# Patient Record
Sex: Male | Born: 1935 | Race: White | Hispanic: No | Marital: Married | Smoking: Never smoker
Health system: Southern US, Community
[De-identification: ages and names within clinical notes are randomized; demographics above are authoritative.]

## PROBLEM LIST (undated history)

## (undated) DIAGNOSIS — F0390 Unspecified dementia without behavioral disturbance: Secondary | ICD-10-CM

## (undated) DIAGNOSIS — R269 Unspecified abnormalities of gait and mobility: Secondary | ICD-10-CM

## (undated) DIAGNOSIS — K5790 Diverticulosis of intestine, part unspecified, without perforation or abscess without bleeding: Secondary | ICD-10-CM

## (undated) DIAGNOSIS — M109 Gout, unspecified: Secondary | ICD-10-CM

## (undated) DIAGNOSIS — M216X9 Other acquired deformities of unspecified foot: Secondary | ICD-10-CM

## (undated) DIAGNOSIS — M1990 Unspecified osteoarthritis, unspecified site: Secondary | ICD-10-CM

## (undated) DIAGNOSIS — I10 Essential (primary) hypertension: Secondary | ICD-10-CM

## (undated) DIAGNOSIS — M5412 Radiculopathy, cervical region: Secondary | ICD-10-CM

## (undated) DIAGNOSIS — E785 Hyperlipidemia, unspecified: Secondary | ICD-10-CM

## (undated) DIAGNOSIS — R413 Other amnesia: Secondary | ICD-10-CM

## (undated) DIAGNOSIS — I639 Cerebral infarction, unspecified: Secondary | ICD-10-CM

## (undated) DIAGNOSIS — T4145XA Adverse effect of unspecified anesthetic, initial encounter: Secondary | ICD-10-CM

## (undated) DIAGNOSIS — G5600 Carpal tunnel syndrome, unspecified upper limb: Secondary | ICD-10-CM

## (undated) DIAGNOSIS — R609 Edema, unspecified: Secondary | ICD-10-CM

## (undated) DIAGNOSIS — G309 Alzheimer's disease, unspecified: Secondary | ICD-10-CM

## (undated) DIAGNOSIS — F0280 Dementia in other diseases classified elsewhere without behavioral disturbance: Secondary | ICD-10-CM

## (undated) DIAGNOSIS — T8859XA Other complications of anesthesia, initial encounter: Secondary | ICD-10-CM

## (undated) DIAGNOSIS — N400 Enlarged prostate without lower urinary tract symptoms: Secondary | ICD-10-CM

## (undated) DIAGNOSIS — M47812 Spondylosis without myelopathy or radiculopathy, cervical region: Secondary | ICD-10-CM

## (undated) HISTORY — DX: Benign prostatic hyperplasia without lower urinary tract symptoms: N40.0

## (undated) HISTORY — DX: Spondylosis without myelopathy or radiculopathy, cervical region: M47.812

## (undated) HISTORY — DX: Carpal tunnel syndrome, unspecified upper limb: G56.00

## (undated) HISTORY — DX: Hyperlipidemia, unspecified: E78.5

## (undated) HISTORY — PX: JOINT REPLACEMENT: SHX530

## (undated) HISTORY — DX: Edema, unspecified: R60.9

## (undated) HISTORY — DX: Essential (primary) hypertension: I10

## (undated) HISTORY — DX: Unspecified osteoarthritis, unspecified site: M19.90

## (undated) HISTORY — PX: CARPAL TUNNEL RELEASE: SHX101

## (undated) HISTORY — DX: Dementia in other diseases classified elsewhere, unspecified severity, without behavioral disturbance, psychotic disturbance, mood disturbance, and anxiety: F02.80

## (undated) HISTORY — PX: OTHER SURGICAL HISTORY: SHX169

## (undated) HISTORY — DX: Alzheimer's disease, unspecified: G30.9

## (undated) HISTORY — DX: Other amnesia: R41.3

## (undated) HISTORY — DX: Other acquired deformities of unspecified foot: M21.6X9

## (undated) HISTORY — DX: Diverticulosis of intestine, part unspecified, without perforation or abscess without bleeding: K57.90

## (undated) HISTORY — DX: Gout, unspecified: M10.9

## (undated) HISTORY — DX: Unspecified abnormalities of gait and mobility: R26.9

## (undated) HISTORY — PX: APPENDECTOMY: SHX54

## (undated) HISTORY — DX: Radiculopathy, cervical region: M54.12

## (undated) HISTORY — DX: Cerebral infarction, unspecified: I63.9

---

## 1997-11-14 ENCOUNTER — Ambulatory Visit (HOSPITAL_COMMUNITY): Admission: RE | Admit: 1997-11-14 | Discharge: 1997-11-14 | Payer: Self-pay | Admitting: Orthopedic Surgery

## 1998-02-06 ENCOUNTER — Encounter: Payer: Self-pay | Admitting: Orthopedic Surgery

## 1998-02-08 ENCOUNTER — Encounter: Payer: Self-pay | Admitting: Orthopedic Surgery

## 1998-02-08 ENCOUNTER — Inpatient Hospital Stay (HOSPITAL_COMMUNITY): Admission: RE | Admit: 1998-02-08 | Discharge: 1998-02-14 | Payer: Self-pay | Admitting: Orthopedic Surgery

## 1998-03-13 ENCOUNTER — Encounter: Admission: RE | Admit: 1998-03-13 | Discharge: 1998-04-26 | Payer: Self-pay | Admitting: Orthopedic Surgery

## 1999-12-27 ENCOUNTER — Encounter: Payer: Self-pay | Admitting: Neurology

## 1999-12-27 ENCOUNTER — Ambulatory Visit (HOSPITAL_COMMUNITY): Admission: RE | Admit: 1999-12-27 | Discharge: 1999-12-27 | Payer: Self-pay | Admitting: Neurology

## 2001-05-19 ENCOUNTER — Encounter: Payer: Self-pay | Admitting: Orthopedic Surgery

## 2001-05-24 ENCOUNTER — Inpatient Hospital Stay (HOSPITAL_COMMUNITY): Admission: RE | Admit: 2001-05-24 | Discharge: 2001-05-29 | Payer: Self-pay | Admitting: Orthopedic Surgery

## 2001-06-17 ENCOUNTER — Encounter: Admission: RE | Admit: 2001-06-17 | Discharge: 2001-09-15 | Payer: Self-pay | Admitting: Orthopedic Surgery

## 2001-08-31 ENCOUNTER — Ambulatory Visit (HOSPITAL_COMMUNITY): Admission: RE | Admit: 2001-08-31 | Discharge: 2001-08-31 | Payer: Self-pay | Admitting: Gastroenterology

## 2001-08-31 ENCOUNTER — Encounter: Payer: Self-pay | Admitting: Internal Medicine

## 2001-08-31 ENCOUNTER — Encounter (INDEPENDENT_AMBULATORY_CARE_PROVIDER_SITE_OTHER): Payer: Self-pay | Admitting: Specialist

## 2001-09-08 ENCOUNTER — Encounter: Payer: Self-pay | Admitting: Gastroenterology

## 2001-09-08 ENCOUNTER — Ambulatory Visit (HOSPITAL_COMMUNITY): Admission: RE | Admit: 2001-09-08 | Discharge: 2001-09-08 | Payer: Self-pay | Admitting: Gastroenterology

## 2003-12-12 ENCOUNTER — Ambulatory Visit: Payer: Self-pay | Admitting: Internal Medicine

## 2003-12-18 ENCOUNTER — Ambulatory Visit (HOSPITAL_COMMUNITY): Admission: RE | Admit: 2003-12-18 | Discharge: 2003-12-18 | Payer: Self-pay | Admitting: Orthopedic Surgery

## 2003-12-18 ENCOUNTER — Encounter: Payer: Self-pay | Admitting: Orthopedic Surgery

## 2004-01-30 ENCOUNTER — Ambulatory Visit (HOSPITAL_COMMUNITY): Admission: RE | Admit: 2004-01-30 | Discharge: 2004-01-30 | Payer: Self-pay | Admitting: Neurology

## 2004-03-26 ENCOUNTER — Ambulatory Visit (HOSPITAL_COMMUNITY): Admission: RE | Admit: 2004-03-26 | Discharge: 2004-03-26 | Payer: Self-pay | Admitting: Orthopedic Surgery

## 2004-03-26 ENCOUNTER — Ambulatory Visit (HOSPITAL_BASED_OUTPATIENT_CLINIC_OR_DEPARTMENT_OTHER): Admission: RE | Admit: 2004-03-26 | Discharge: 2004-03-26 | Payer: Self-pay | Admitting: Orthopedic Surgery

## 2004-04-30 ENCOUNTER — Encounter
Admission: RE | Admit: 2004-04-30 | Discharge: 2004-08-05 | Payer: Self-pay | Admitting: Physical Medicine and Rehabilitation

## 2004-05-29 ENCOUNTER — Ambulatory Visit: Payer: Self-pay | Admitting: Internal Medicine

## 2004-08-05 ENCOUNTER — Ambulatory Visit: Payer: Self-pay | Admitting: Internal Medicine

## 2004-09-02 ENCOUNTER — Ambulatory Visit: Payer: Self-pay | Admitting: Internal Medicine

## 2004-12-02 ENCOUNTER — Ambulatory Visit: Payer: Self-pay | Admitting: Internal Medicine

## 2005-02-06 ENCOUNTER — Ambulatory Visit: Payer: Self-pay | Admitting: Licensed Clinical Social Worker

## 2005-02-13 ENCOUNTER — Ambulatory Visit: Payer: Self-pay | Admitting: Licensed Clinical Social Worker

## 2005-02-20 ENCOUNTER — Ambulatory Visit: Payer: Self-pay | Admitting: Licensed Clinical Social Worker

## 2005-03-04 ENCOUNTER — Ambulatory Visit: Payer: Self-pay | Admitting: Internal Medicine

## 2005-04-01 ENCOUNTER — Encounter: Admission: RE | Admit: 2005-04-01 | Discharge: 2005-04-01 | Payer: Self-pay | Admitting: Neurology

## 2005-05-28 ENCOUNTER — Encounter: Payer: Self-pay | Admitting: Orthopedic Surgery

## 2005-06-05 ENCOUNTER — Ambulatory Visit: Payer: Self-pay | Admitting: Internal Medicine

## 2005-08-15 ENCOUNTER — Ambulatory Visit: Payer: Self-pay | Admitting: Internal Medicine

## 2005-08-29 ENCOUNTER — Ambulatory Visit: Payer: Self-pay | Admitting: Internal Medicine

## 2005-09-08 ENCOUNTER — Ambulatory Visit: Payer: Self-pay | Admitting: Internal Medicine

## 2005-11-21 ENCOUNTER — Ambulatory Visit: Payer: Self-pay | Admitting: Internal Medicine

## 2005-12-29 ENCOUNTER — Ambulatory Visit: Payer: Self-pay | Admitting: Internal Medicine

## 2006-03-30 ENCOUNTER — Ambulatory Visit: Payer: Self-pay | Admitting: Internal Medicine

## 2006-08-31 DIAGNOSIS — N400 Enlarged prostate without lower urinary tract symptoms: Secondary | ICD-10-CM

## 2006-08-31 DIAGNOSIS — M1990 Unspecified osteoarthritis, unspecified site: Secondary | ICD-10-CM | POA: Insufficient documentation

## 2006-08-31 DIAGNOSIS — I10 Essential (primary) hypertension: Secondary | ICD-10-CM | POA: Insufficient documentation

## 2006-08-31 DIAGNOSIS — Z8679 Personal history of other diseases of the circulatory system: Secondary | ICD-10-CM | POA: Insufficient documentation

## 2006-09-11 ENCOUNTER — Telehealth: Payer: Self-pay | Admitting: *Deleted

## 2006-10-29 ENCOUNTER — Telehealth: Payer: Self-pay | Admitting: Internal Medicine

## 2006-12-03 ENCOUNTER — Ambulatory Visit: Payer: Self-pay | Admitting: Internal Medicine

## 2007-01-19 ENCOUNTER — Emergency Department (HOSPITAL_COMMUNITY): Admission: EM | Admit: 2007-01-19 | Discharge: 2007-01-20 | Payer: Self-pay | Admitting: Emergency Medicine

## 2007-11-03 ENCOUNTER — Ambulatory Visit: Payer: Self-pay | Admitting: Internal Medicine

## 2007-11-03 DIAGNOSIS — G5600 Carpal tunnel syndrome, unspecified upper limb: Secondary | ICD-10-CM

## 2007-11-03 DIAGNOSIS — E785 Hyperlipidemia, unspecified: Secondary | ICD-10-CM | POA: Insufficient documentation

## 2007-11-03 DIAGNOSIS — F068 Other specified mental disorders due to known physiological condition: Secondary | ICD-10-CM

## 2007-11-03 DIAGNOSIS — K5730 Diverticulosis of large intestine without perforation or abscess without bleeding: Secondary | ICD-10-CM | POA: Insufficient documentation

## 2007-11-03 DIAGNOSIS — Z96659 Presence of unspecified artificial knee joint: Secondary | ICD-10-CM | POA: Insufficient documentation

## 2007-11-03 DIAGNOSIS — R269 Unspecified abnormalities of gait and mobility: Secondary | ICD-10-CM

## 2007-11-03 DIAGNOSIS — Z96649 Presence of unspecified artificial hip joint: Secondary | ICD-10-CM

## 2008-03-01 ENCOUNTER — Ambulatory Visit: Payer: Self-pay | Admitting: Internal Medicine

## 2008-03-01 DIAGNOSIS — G63 Polyneuropathy in diseases classified elsewhere: Secondary | ICD-10-CM | POA: Insufficient documentation

## 2008-03-06 ENCOUNTER — Encounter: Payer: Self-pay | Admitting: Internal Medicine

## 2008-03-16 LAB — CONVERTED CEMR LAB
BUN: 29 mg/dL — ABNORMAL HIGH (ref 6–23)
CO2: 26 meq/L (ref 19–32)
Calcium: 9.3 mg/dL (ref 8.4–10.5)
Creatinine, Ser: 1.2 mg/dL (ref 0.4–1.5)
GFR calc non Af Amer: 62 mL/min
Glucose, Bld: 110 mg/dL — ABNORMAL HIGH (ref 70–99)
Vitamin B-12: 504 pg/mL (ref 211–911)

## 2008-03-21 ENCOUNTER — Ambulatory Visit: Payer: Self-pay | Admitting: Internal Medicine

## 2008-06-01 ENCOUNTER — Ambulatory Visit: Payer: Self-pay | Admitting: Internal Medicine

## 2008-07-14 ENCOUNTER — Encounter: Payer: Self-pay | Admitting: Internal Medicine

## 2008-09-04 ENCOUNTER — Ambulatory Visit: Payer: Self-pay | Admitting: Internal Medicine

## 2008-09-04 DIAGNOSIS — E291 Testicular hypofunction: Secondary | ICD-10-CM

## 2008-09-04 LAB — CONVERTED CEMR LAB
CO2: 27 meq/L (ref 19–32)
Calcium: 9.3 mg/dL (ref 8.4–10.5)
Chloride: 112 meq/L (ref 96–112)
Creatinine, Ser: 1.2 mg/dL (ref 0.4–1.5)
GFR calc non Af Amer: 61.66 mL/min (ref >60)
Glucose, Bld: 101 mg/dL — ABNORMAL HIGH (ref 70–99)

## 2008-11-27 ENCOUNTER — Ambulatory Visit: Payer: Self-pay | Admitting: Internal Medicine

## 2009-01-25 ENCOUNTER — Ambulatory Visit: Payer: Self-pay | Admitting: Internal Medicine

## 2009-03-05 ENCOUNTER — Encounter: Payer: Self-pay | Admitting: Internal Medicine

## 2009-03-15 ENCOUNTER — Encounter: Admission: RE | Admit: 2009-03-15 | Discharge: 2009-03-15 | Payer: Self-pay | Admitting: Neurology

## 2009-03-23 ENCOUNTER — Ambulatory Visit: Payer: Self-pay | Admitting: Internal Medicine

## 2009-03-23 DIAGNOSIS — E538 Deficiency of other specified B group vitamins: Secondary | ICD-10-CM

## 2009-04-20 ENCOUNTER — Ambulatory Visit: Payer: Self-pay | Admitting: Internal Medicine

## 2009-05-25 ENCOUNTER — Ambulatory Visit: Payer: Self-pay | Admitting: Internal Medicine

## 2009-06-07 ENCOUNTER — Encounter: Payer: Self-pay | Admitting: Internal Medicine

## 2009-08-23 ENCOUNTER — Ambulatory Visit: Payer: Self-pay | Admitting: Internal Medicine

## 2009-11-22 ENCOUNTER — Ambulatory Visit: Payer: Self-pay | Admitting: Internal Medicine

## 2009-11-22 LAB — CONVERTED CEMR LAB
CO2: 28 meq/L (ref 19–32)
Chloride: 109 meq/L (ref 96–112)
Folate: 12.3 ng/mL
GFR calc non Af Amer: 61.55 mL/min (ref >60)
Sodium: 144 meq/L (ref 135–145)
Testosterone: 308.62 ng/dL — ABNORMAL LOW (ref 350.00–890.00)

## 2010-02-21 ENCOUNTER — Ambulatory Visit
Admission: RE | Admit: 2010-02-21 | Discharge: 2010-02-21 | Payer: Self-pay | Source: Home / Self Care | Attending: Internal Medicine | Admitting: Internal Medicine

## 2010-02-21 ENCOUNTER — Encounter: Payer: Self-pay | Admitting: Internal Medicine

## 2010-02-24 LAB — CONVERTED CEMR LAB
ALT: 32 units/L (ref 0–53)
AST: 32 units/L (ref 0–37)
Alkaline Phosphatase: 72 units/L (ref 39–117)
Bilirubin, Direct: 0.2 mg/dL (ref 0.0–0.3)
Cholesterol: 193 mg/dL (ref 0–200)
GFR calc non Af Amer: 48 mL/min
HDL goal, serum: 40 mg/dL
HDL: 37.3 mg/dL — ABNORMAL LOW (ref >39.0)
LDL Cholesterol: 137 mg/dL — ABNORMAL HIGH (ref 0–99)
LDL Goal: 130 mg/dL
Potassium: 5.4 meq/L — ABNORMAL HIGH (ref 3.5–5.1)
Total Protein: 7 g/dL (ref 6.0–8.3)
VLDL: 19 mg/dL (ref 0–40)

## 2010-02-26 NOTE — Assessment & Plan Note (Signed)
Summary: 3 mo rov/mm/pt rsc from bmp/cjr   Vital Signs:  Patient profile:   77 year old male Height:      71 inches Weight:      198 pounds BMI:     27.72 Temp:     98.2 degrees F oral Pulse rate:   76 / minute Resp:     14 per minute BP sitting:   136 / 80  (left arm)  Vitals Entered By: Willy Eddy, LPN (August 23, 2009 9:36 AM) CC: roa, Hypertension Management Is Patient Diabetic? No   Primary Care Provider:  Stacie Glaze MD  CC:  roa and Hypertension Management.  History of Present Illness: The pt presents for follow up for HTN and memory follow up  Hypertension Follow-Up      This is an 77 year old man who presents for Hypertension follow-up.  The patient denies lightheadedness, urinary frequency, headaches, edema, impotence, rash, and fatigue.  The patient denies the following associated symptoms: chest pain, chest pressure, exercise intolerance, dyspnea, palpitations, syncope, leg edema, and pedal edema.    Hypertension History:      He denies headache, chest pain, palpitations, dyspnea with exertion, orthopnea, PND, peripheral edema, visual symptoms, neurologic problems, syncope, and side effects from treatment.        Positive major cardiovascular risk factors include male age 75 years old or older, hyperlipidemia, and hypertension.  Negative major cardiovascular risk factors include negative family history for ischemic heart disease and non-tobacco-user status.     Preventive Screening-Counseling & Management  Alcohol-Tobacco     Alcohol drinks/day: 3     Smoking Status: never     Passive Smoke Exposure: no  Problems Prior to Update: 1)  B12 Deficiency  (ICD-266.2) 2)  Hypogonadism  (ICD-257.2) 3)  Polyneuropathy Other Diseases Classified Elsw  (ICD-357.4) 4)  Diverticulosis, Colon  (ICD-562.10) 5)  Carpal Tunnel Syndrome  (ICD-354.0) 6)  Unsteady Gait  (ICD-781.2) 7)  Memory Loss  (ICD-780.93) 8)  Hip Replacement, Total, Hx of  (ICD-V43.64) 9)   Total Knee Replacement, Right, Hx of  (ICD-V43.65) 10)  Hyperlipidemia  (ICD-272.4) 11)  Cerebrovascular Accident, Hx of  (ICD-V12.50) 12)  Family History Diabetes 1st Degree Relative  (ICD-V18.0) 13)  Benign Prostatic Hypertrophy  (ICD-600.00) 14)  Osteoarthritis  (ICD-715.90) 15)  Hypertension  (ICD-401.9)  Medications Prior to Update: 1)  Aspirin 81 Mg  Tabs (Aspirin) .... Take 1 Tablet By Mouth Once A Day 2)  Lisinopril 10 Mg  Tabs (Lisinopril) .... Take 1 Tablet By Mouth Once A Day 3)  Restoril 30 Mg  Caps (Temazepam) .... Q Hs As Needed 4)  Namenda 10 Mg Tabs (Memantine Hcl) .... One By Mouth Two Times A Day 5)  Cyanocobalamin 1000 Mcg/ml Soln (Cyanocobalamin) .Marland Kitchen.. 1 Ml Monthly 6)  Aricept 5 Mg Tabs (Donepezil Hcl) .Marland Kitchen.. 1 Once Daily  Current Medications (verified): 1)  Aspirin 81 Mg  Tabs (Aspirin) .... Take 1 Tablet By Mouth Once A Day 2)  Lisinopril 10 Mg  Tabs (Lisinopril) .... Take 1 Tablet By Mouth Once A Day 3)  Restoril 30 Mg  Caps (Temazepam) .... Q Hs As Needed 4)  Namenda 10 Mg Tabs (Memantine Hcl) .... One By Mouth Two Times A Day 5)  Cyanocobalamin 1000 Mcg/ml Soln (Cyanocobalamin) .Marland Kitchen.. 1 Ml Monthly 6)  Aricept 5 Mg Tabs (Donepezil Hcl) .Marland Kitchen.. 1 Once Daily  Allergies (verified): No Known Drug Allergies  Past History:  Family History: Last updated: 08/31/2006 Family History  Diabetes 1st degree relative Family History of Stroke F 1st degree relative <60 Fam hx MI  Social History: Last updated: 08/31/2006 Never Smoked Alcohol use-yes Married  Risk Factors: Alcohol Use: 3 (08/23/2009) Exercise: no (11/03/2007)  Risk Factors: Smoking Status: never (08/23/2009) Passive Smoke Exposure: no (08/23/2009)  Past medical, surgical, family and social histories (including risk factors) reviewed, and no changes noted (except as noted below).  Past Medical History: Reviewed history from 11/03/2007 and no changes required. Hypertension Osteoarthritis Benign  prostatic hypertrophy Carpel Tunnel Lipids Cerebrovascular accident, hx of-1988 Hyperlipidemia Diverticulosis, colon  Past Surgical History: Reviewed history from 08/31/2006 and no changes required. Appendectomy Colonoscopy-09/23/2004 Carpal tunnel release Hip replacement-left Knee replacement-right  Family History: Reviewed history from 08/31/2006 and no changes required. Family History Diabetes 1st degree relative Family History of Stroke F 1st degree relative <60 Fam hx MI  Social History: Reviewed history from 08/31/2006 and no changes required. Never Smoked Alcohol use-yes Married  Review of Systems  The patient denies anorexia, fever, weight loss, weight gain, vision loss, decreased hearing, hoarseness, chest pain, syncope, dyspnea on exertion, peripheral edema, prolonged cough, headaches, hemoptysis, abdominal pain, melena, hematochezia, severe indigestion/heartburn, hematuria, incontinence, genital sores, muscle weakness, suspicious skin lesions, transient blindness, difficulty walking, depression, unusual weight change, abnormal bleeding, enlarged lymph nodes, angioedema, and breast masses.    Physical Exam  General:  well-developed and well-nourished.   Head:  normocephalic and male-pattern balding.   Eyes:  pupils equal and pupils round.   Ears:  R ear normal and L ear normal.   Nose:  no external deformity and no nasal discharge.   Neck:  nuchal rigidity and decreased ROM.   Lungs:  normal respiratory effort and no wheezes.   Heart:  normal rate, regular rhythm, and Grade   2/6 systolic ejection murmur.   Abdomen:  soft, non-tender, and no masses.   Msk:  decreased ROM.   upper extremity muscle waisting Neurologic:  alert & oriented X3 and cranial nerves II-XII intact.     Impression & Recommendations:  Problem # 1:  MEMORY LOSS (ICD-780.93) progressive but not accellerating  Problem # 2:  TOTAL KNEE REPLACEMENT, RIGHT, HX OF (ICD-V43.65) stable  still  goes to PT about twice a week  Problem # 3:  UNSTEADY GAIT (ICD-781.2) still going to exercize classes  Problem # 4:  B12 DEFICIENCY (ICD-266.2) Assessment: Unchanged b 12 shots for neuropathy Orders: Vit B12 1000 mcg (J3420) Admin of Therapeutic Inj  intramuscular or subcutaneous (21308)  Problem # 5:  HYPERTENSION (ICD-401.9)  His updated medication list for this problem includes:    Lisinopril 10 Mg Tabs (Lisinopril) .Marland Kitchen... Take 1 tablet by mouth once a day  BP today: 136/80 Prior BP: 140/80 (05/25/2009)  10 Yr Risk Heart Disease: 22 % Prior 10 Yr Risk Heart Disease: 33 % (05/25/2009)  Labs Reviewed: K+: 4.9 (05/25/2009) Creat: : 1.2 (05/25/2009)   Chol: 185 (05/25/2009)   HDL: 45.90 (05/25/2009)   LDL: 137 (11/03/2007)   TG: 93 (11/03/2007)  Problem # 6:  BENIGN PROSTATIC HYPERTROPHY (ICD-600.00)  PSA: 4.01 (09/04/2008)     Complete Medication List: 1)  Aspirin 81 Mg Tabs (Aspirin) .... Take 1 tablet by mouth once a day 2)  Lisinopril 10 Mg Tabs (Lisinopril) .... Take 1 tablet by mouth once a day 3)  Restoril 30 Mg Caps (Temazepam) .... Q hs as needed 4)  Namenda 10 Mg Tabs (Memantine hcl) .... One by mouth two times a day 5)  Cyanocobalamin 1000  Mcg/ml Soln (Cyanocobalamin) .Marland Kitchen.. 1 ml monthly 6)  Aricept 5 Mg Tabs (Donepezil hcl) .Marland Kitchen.. 1 once daily  Hypertension Assessment/Plan:      The patient's hypertensive risk group is category B: At least one risk factor (excluding diabetes) with no target organ damage.  His calculated 10 year risk of coronary heart disease is 22 %.  Today's blood pressure is 136/80.  His blood pressure goal is < 140/90.  Patient Instructions: 1)  Please schedule a follow-up appointment in 3 months.   Medication Administration  Injection # 1:    Medication: Vit B12 1000 mcg    Diagnosis: B12 DEFICIENCY (ICD-266.2)    Route: IM    Site: R deltoid    Exp Date: 10/25/2010    Lot #: [id number]    Mfr: American Regent    Patient tolerated  injection without complications    Given by: Willy Eddy, LPN (August 23, 2009 9:37 AM)  Orders Added: 1)  Vit B12 1000 mcg [J3420] 2)  Admin of Therapeutic Inj  intramuscular or subcutaneous [96372] 3)  Est. Patient Level IV [16109]

## 2010-02-26 NOTE — Assessment & Plan Note (Signed)
Summary: 2 month rov/njr   Vital Signs:  Patient profile:   77 year old male Height:      71 inches Weight:      198 pounds BMI:     27.72 Temp:     98.2 degrees F oral Pulse rate:   76 / minute Resp:     14 per minute BP sitting:   122 / 78  (left arm)  Vitals Entered By: Willy Eddy, LPN (March 23, 2009 9:13 AM) CC: roa, Hypertension Management   CC:  roa and Hypertension Management.  History of Present Illness: folllow up for increased OBS and gait disorder with progressive dementia HTN and reveoiwe dof med use and b12 injectons wife is present wqith pt for rreveiw of all meds the consult form Dr Sandria Manly was reveiwed with the pt.  Hypertension History:      He denies headache, chest pain, palpitations, dyspnea with exertion, orthopnea, PND, peripheral edema, visual symptoms, neurologic problems, syncope, and side effects from treatment.        Positive major cardiovascular risk factors include male age 8 years old or older, hyperlipidemia, and hypertension.  Negative major cardiovascular risk factors include negative family history for ischemic heart disease and non-tobacco-user status.     Preventive Screening-Counseling & Management  Alcohol-Tobacco     Alcohol drinks/day: 3     Smoking Status: never     Passive Smoke Exposure: no  Problems Prior to Update: 1)  B12 Deficiency  (ICD-266.2) 2)  Hypogonadism  (ICD-257.2) 3)  Polyneuropathy Other Diseases Classified Elsw  (ICD-357.4) 4)  Diverticulosis, Colon  (ICD-562.10) 5)  Carpal Tunnel Syndrome  (ICD-354.0) 6)  Unsteady Gait  (ICD-781.2) 7)  Memory Loss  (ICD-780.93) 8)  Hip Replacement, Total, Hx of  (ICD-V43.64) 9)  Total Knee Replacement, Right, Hx of  (ICD-V43.65) 10)  Hyperlipidemia  (ICD-272.4) 11)  Cerebrovascular Accident, Hx of  (ICD-V12.50) 12)  Family History Diabetes 1st Degree Relative  (ICD-V18.0) 13)  Benign Prostatic Hypertrophy  (ICD-600.00) 14)  Osteoarthritis  (ICD-715.90) 15)   Hypertension  (ICD-401.9)  Medications Prior to Update: 1)  Aspirin 81 Mg  Tabs (Aspirin) .... Take 1 Tablet By Mouth Once A Day 2)  Lisinopril 10 Mg  Tabs (Lisinopril) .... Take 1 Tablet By Mouth Once A Day 3)  Restoril 30 Mg  Caps (Temazepam) .... Q Hs As Needed 4)  D 1000 Plus  Tabs (Fa-Cyanocobalamin-B6-D-Ca) .Marland Kitchen.. 1 Once Daily 5)  Fish Oil Concentrate 1000 Mg Caps (Omega-3 Fatty Acids) .... Two Capsules Two Times A Day 6)  Namenda 10 Mg Tabs (Memantine Hcl) .... One By Mouth Two Times A Day 7)  Cyanocobalamin 1000 Mcg/ml Soln (Cyanocobalamin) .Marland Kitchen.. 1 Ml Monthly  Current Medications (verified): 1)  Aspirin 81 Mg  Tabs (Aspirin) .... Take 1 Tablet By Mouth Once A Day 2)  Lisinopril 10 Mg  Tabs (Lisinopril) .... Take 1 Tablet By Mouth Once A Day 3)  Restoril 30 Mg  Caps (Temazepam) .... Q Hs As Needed 4)  Namenda 10 Mg Tabs (Memantine Hcl) .... One By Mouth Two Times A Day 5)  Cyanocobalamin 1000 Mcg/ml Soln (Cyanocobalamin) .Marland Kitchen.. 1 Ml Monthly 6)  Aricept 5 Mg Tabs (Donepezil Hcl) .Marland Kitchen.. 1 Once Daily  Allergies (verified): No Known Drug Allergies  Past History:  Family History: Last updated: 08/31/2006 Family History Diabetes 1st degree relative Family History of Stroke F 1st degree relative <60 Fam hx MI  Social History: Last updated: 08/31/2006 Never Smoked Alcohol use-yes Married  Risk Factors: Alcohol Use: 3 (03/23/2009) Exercise: no (11/03/2007)  Risk Factors: Smoking Status: never (03/23/2009) Passive Smoke Exposure: no (03/23/2009)  Past medical, surgical, family and social histories (including risk factors) reviewed, and no changes noted (except as noted below).  Past Medical History: Reviewed history from 11/03/2007 and no changes required. Hypertension Osteoarthritis Benign prostatic hypertrophy Carpel Tunnel Lipids Cerebrovascular accident, hx of-1988 Hyperlipidemia Diverticulosis, colon  Past Surgical History: Reviewed history from 08/31/2006 and no  changes required. Appendectomy Colonoscopy-09/23/2004 Carpal tunnel release Hip replacement-left Knee replacement-right  Family History: Reviewed history from 08/31/2006 and no changes required. Family History Diabetes 1st degree relative Family History of Stroke F 1st degree relative <60 Fam hx MI  Social History: Reviewed history from 08/31/2006 and no changes required. Never Smoked Alcohol use-yes Married  Review of Systems  The patient denies anorexia, fever, weight loss, weight gain, vision loss, decreased hearing, hoarseness, chest pain, syncope, dyspnea on exertion, peripheral edema, prolonged cough, headaches, hemoptysis, abdominal pain, melena, hematochezia, severe indigestion/heartburn, hematuria, incontinence, genital sores, muscle weakness, suspicious skin lesions, transient blindness, difficulty walking, depression, unusual weight change, abnormal bleeding, enlarged lymph nodes, angioedema, and breast masses.    Physical Exam  General:  well-developed and well-nourished.   Head:  normocephalic and male-pattern balding.   Eyes:  pupils equal and pupils round.   Ears:  R ear normal and L ear normal.   Nose:  no external deformity and no nasal discharge.   Mouth:  pharynx pink and moist and fair dentition.   Neck:  nuchal rigidity and decreased ROM.   Lungs:  normal respiratory effort and no wheezes.   Heart:  normal rate, regular rhythm, and Grade   2/6 systolic ejection murmur.     Impression & Recommendations:  Problem # 1:  B12 DEFICIENCY (ICD-266.2) has been on long standing B12 def document and has been on both shots and nasal his wife will help him  to recall getting the shots Orders: Vit B12 1000 mcg (J3420) Admin of Therapeutic Inj  intramuscular or subcutaneous (60454)  Problem # 2:  UNSTEADY GAIT (ICD-781.2) due to neuropathy and  Problem # 3:  MEMORY LOSS (ICD-780.93) OBS form ischemic change add vit e 400 iu  Problem # 4:  POLYNEUROPATHY  OTHER DISEASES CLASSIFIED ELSW (ICD-357.4) continue the b12 shots  Problem # 5:  HYPERTENSION (ICD-401.9)  His updated medication list for this problem includes:    Lisinopril 10 Mg Tabs (Lisinopril) .Marland Kitchen... Take 1 tablet by mouth once a day  BP today: 122/78 Prior BP: 126/80 (01/25/2009)  Prior 10 Yr Risk Heart Disease: 22 % (01/25/2009)  Labs Reviewed: K+: 5.0 (09/04/2008) Creat: : 1.2 (09/04/2008)   Chol: 185 (09/04/2008)   HDL: 41.20 (09/04/2008)   LDL: 137 (11/03/2007)   TG: 93 (11/03/2007)  Complete Medication List: 1)  Aspirin 81 Mg Tabs (Aspirin) .... Take 1 tablet by mouth once a day 2)  Lisinopril 10 Mg Tabs (Lisinopril) .... Take 1 tablet by mouth once a day 3)  Restoril 30 Mg Caps (Temazepam) .... Q hs as needed 4)  Namenda 10 Mg Tabs (Memantine hcl) .... One by mouth two times a day 5)  Cyanocobalamin 1000 Mcg/ml Soln (Cyanocobalamin) .Marland Kitchen.. 1 ml monthly 6)  Aricept 5 Mg Tabs (Donepezil hcl) .Marland Kitchen.. 1 once daily  Hypertension Assessment/Plan:      The patient's hypertensive risk group is category B: At least one risk factor (excluding diabetes) with no target organ damage.  His calculated 10 year risk of coronary  heart disease is 22 %.  Today's blood pressure is 122/78.  His blood pressure goal is < 140/90.  Patient Instructions: 1)  Please schedule a follow-up appointment in 2 months. 2)  get b 12 shot in one month 3)  cannot drive until released by Dr Sandria Manly 4)   and continue the let your wife administer all medications   Medication Administration  Injection # 1:    Medication: Vit B12 1000 mcg    Diagnosis: B12 DEFICIENCY (ICD-266.2)    Route: IM    Site: L deltoid    Exp Date: 09/27/2010    Lot #: [id number]    Mfr: American Regent    Patient tolerated injection without complications    Given by: Willy Eddy, LPN (March 23, 2009 9:16 AM)  Orders Added: 1)  Vit B12 1000 mcg [J3420] 2)  Admin of Therapeutic Inj  intramuscular or subcutaneous [96372] 3)   Est. Patient Level IV [11914]

## 2010-02-26 NOTE — Assessment & Plan Note (Signed)
Summary: 3 MNTH ROV//SLM   Vital Signs:  Patient profile:   77 year old male Height:      71 inches Weight:      200 pounds BMI:     28.00 Temp:     98.2 degrees F oral Pulse rate:   72 / minute Resp:     14 per minute BP sitting:   130 / 80  (left arm)  Vitals Entered By: Willy Eddy, LPN (November 22, 2009 9:32 AM)  Nutrition Counseling: Patient's BMI is greater than 25 and therefore counseled on weight management options. CC: roa, Hypertension Management Is Patient Diabetic? No   Primary Care Provider:  Stacie Glaze MD  CC:  roa and Hypertension Management.  History of Present Illness: the pt has been "doing well" Has recieved immunizations Stll going the PT twice a weeks Has been cutting back down  on driving  and has not been driving at night HTN controlled believs that he has been taking all of his medications as directed "once a day"  Hypertension History:      He denies headache, chest pain, palpitations, dyspnea with exertion, orthopnea, PND, peripheral edema, visual symptoms, neurologic problems, syncope, and side effects from treatment.        Positive major cardiovascular risk factors include male age 39 years old or older, hyperlipidemia, and hypertension.  Negative major cardiovascular risk factors include negative family history for ischemic heart disease and non-tobacco-user status.     Preventive Screening-Counseling & Management  Alcohol-Tobacco     Alcohol drinks/day: 3     Smoking Status: never     Passive Smoke Exposure: no     Tobacco Counseling: not indicated; no tobacco use  Problems Prior to Update: 1)  B12 Deficiency  (ICD-266.2) 2)  Hypogonadism  (ICD-257.2) 3)  Polyneuropathy Other Diseases Classified Elsw  (ICD-357.4) 4)  Diverticulosis, Colon  (ICD-562.10) 5)  Carpal Tunnel Syndrome  (ICD-354.0) 6)  Unsteady Gait  (ICD-781.2) 7)  Memory Loss  (ICD-780.93) 8)  Hip Replacement, Total, Hx of  (ICD-V43.64) 9)  Total Knee  Replacement, Right, Hx of  (ICD-V43.65) 10)  Hyperlipidemia  (ICD-272.4) 11)  Cerebrovascular Accident, Hx of  (ICD-V12.50) 12)  Family History Diabetes 1st Degree Relative  (ICD-V18.0) 13)  Benign Prostatic Hypertrophy  (ICD-600.00) 14)  Osteoarthritis  (ICD-715.90) 15)  Hypertension  (ICD-401.9)  Current Problems (verified): 1)  B12 Deficiency  (ICD-266.2) 2)  Hypogonadism  (ICD-257.2) 3)  Polyneuropathy Other Diseases Classified Elsw  (ICD-357.4) 4)  Diverticulosis, Colon  (ICD-562.10) 5)  Carpal Tunnel Syndrome  (ICD-354.0) 6)  Unsteady Gait  (ICD-781.2) 7)  Memory Loss  (ICD-780.93) 8)  Hip Replacement, Total, Hx of  (ICD-V43.64) 9)  Total Knee Replacement, Right, Hx of  (ICD-V43.65) 10)  Hyperlipidemia  (ICD-272.4) 11)  Cerebrovascular Accident, Hx of  (ICD-V12.50) 12)  Family History Diabetes 1st Degree Relative  (ICD-V18.0) 13)  Benign Prostatic Hypertrophy  (ICD-600.00) 14)  Osteoarthritis  (ICD-715.90) 15)  Hypertension  (ICD-401.9)  Medications Prior to Update: 1)  Aspirin 81 Mg  Tabs (Aspirin) .... Take 1 Tablet By Mouth Once A Day 2)  Lisinopril 10 Mg  Tabs (Lisinopril) .... Take 1 Tablet By Mouth Once A Day 3)  Restoril 30 Mg  Caps (Temazepam) .... Q Hs As Needed 4)  Namenda 10 Mg Tabs (Memantine Hcl) .... One By Mouth Two Times A Day 5)  Cyanocobalamin 1000 Mcg/ml Soln (Cyanocobalamin) .Marland Kitchen.. 1 Ml Monthly 6)  Aricept 5 Mg Tabs (Donepezil Hcl) .Marland KitchenMarland KitchenMarland Kitchen  1 Once Daily  Current Medications (verified): 1)  Aspirin 81 Mg  Tabs (Aspirin) .... Take 1 Tablet By Mouth Once A Day 2)  Lisinopril 10 Mg  Tabs (Lisinopril) .... Take 1 Tablet By Mouth Once A Day 3)  Restoril 30 Mg  Caps (Temazepam) .... Q Hs As Needed 4)  Namenda 10 Mg Tabs (Memantine Hcl) .... One By Mouth Two Times A Day 5)  Cyanocobalamin 1000 Mcg/ml Soln (Cyanocobalamin) .Marland Kitchen.. 1 Ml Monthly 6)  Aricept 5 Mg Tabs (Donepezil Hcl) .Marland Kitchen.. 1 Once Daily  Allergies (verified): No Known Drug Allergies  Past  History:  Family History: Last updated: 08/31/2006 Family History Diabetes 1st degree relative Family History of Stroke F 1st degree relative <60 Fam hx MI  Social History: Last updated: 08/31/2006 Never Smoked Alcohol use-yes Married  Risk Factors: Alcohol Use: 3 (11/22/2009) Exercise: no (11/03/2007)  Risk Factors: Smoking Status: never (11/22/2009) Passive Smoke Exposure: no (11/22/2009)  Past medical, surgical, family and social histories (including risk factors) reviewed, and no changes noted (except as noted below).  Past Medical History: Reviewed history from 11/03/2007 and no changes required. Hypertension Osteoarthritis Benign prostatic hypertrophy Carpel Tunnel Lipids Cerebrovascular accident, hx of-1988 Hyperlipidemia Diverticulosis, colon  Past Surgical History: Reviewed history from 08/31/2006 and no changes required. Appendectomy Colonoscopy-09/23/2004 Carpal tunnel release Hip replacement-left Knee replacement-right  Family History: Reviewed history from 08/31/2006 and no changes required. Family History Diabetes 1st degree relative Family History of Stroke F 1st degree relative <60 Fam hx MI  Social History: Reviewed history from 08/31/2006 and no changes required. Never Smoked Alcohol use-yes Married  Review of Systems  The patient denies anorexia, fever, weight loss, weight gain, vision loss, decreased hearing, hoarseness, chest pain, syncope, dyspnea on exertion, peripheral edema, prolonged cough, headaches, hemoptysis, abdominal pain, melena, hematochezia, severe indigestion/heartburn, hematuria, incontinence, genital sores, muscle weakness, suspicious skin lesions, transient blindness, difficulty walking, depression, unusual weight change, abnormal bleeding, enlarged lymph nodes, angioedema, and breast masses.         Flu Vaccine Consent Questions     Do you have a history of severe allergic reactions to this vaccine? no    Any prior  history of allergic reactions to egg and/or gelatin? no    Do you have a sensitivity to the preservative Thimersol? no    Do you have a past history of Guillan-Barre Syndrome? no    Do you currently have an acute febrile illness? no    Have you ever had a severe reaction to latex? no    Vaccine information given and explained to patient? yes    Are you currently pregnant? no    Lot Number:[id number]   Exp Date:07/27/2010   Site Given  Left Deltoid IM   Physical Exam  General:  well-developed and well-nourished.   Head:  normocephalic and male-pattern balding.   Eyes:  pupils equal and pupils round.   Ears:  R ear normal and L ear normal.   Nose:  no external deformity and no nasal discharge.   Mouth:  pharynx pink and moist and fair dentition.   Neck:  nuchal rigidity and decreased ROM.   Lungs:  normal respiratory effort and no wheezes.   Heart:  normal rate, regular rhythm, and Grade   2/6 systolic ejection murmur.   Abdomen:  soft, non-tender, and no masses.     Impression & Recommendations:  Problem # 1:  POLYNEUROPATHY OTHER DISEASES CLASSIFIED ELSW (ICD-357.4) Hands are still numb on the ned of fingertips  Problem # 2:  CARPAL TUNNEL SYNDROME (ICD-354.0)  probable caused  Labs Reviewed: TSH: 2.18 (09/04/2008)     Problem # 3:  UNSTEADY GAIT (ICD-781.2) due to OA and increased fall risk must continue to take 1000 steps  Problem # 4:  HYPERTENSION (ICD-401.9)  His updated medication list for this problem includes:    Lisinopril 10 Mg Tabs (Lisinopril) .Marland Kitchen... Take 1 tablet by mouth once a day  BP today: 130/80 Prior BP: 136/80 (08/23/2009)  Prior 10 Yr Risk Heart Disease: 22 % (08/23/2009)  Labs Reviewed: K+: 4.9 (05/25/2009) Creat: : 1.2 (05/25/2009)   Chol: 185 (05/25/2009)   HDL: 45.90 (05/25/2009)   LDL: 137 (11/03/2007)   TG: 93 (11/03/2007)  Problem # 5:  MEMORY LOSS (ICD-780.93) on nameda and aricept  Complete Medication List: 1)  Aspirin 81 Mg Tabs  (Aspirin) .... Take 1 tablet by mouth once a day 2)  Lisinopril 10 Mg Tabs (Lisinopril) .... Take 1 tablet by mouth once a day 3)  Restoril 30 Mg Caps (Temazepam) .... Q hs as needed 4)  Namenda 10 Mg Tabs (Memantine hcl) .... One by mouth two times a day 5)  Cyanocobalamin 1000 Mcg/ml Soln (Cyanocobalamin) .Marland Kitchen.. 1 ml monthly 6)  Aricept 5 Mg Tabs (Donepezil hcl) .Marland Kitchen.. 1 once daily  Other Orders: Flu Vaccine 31yrs + MEDICARE PATIENTS (Z6109) Administration Flu vaccine - MCR (G0008) Vit B12 1000 mcg (J3420) Admin of Therapeutic Inj  intramuscular or subcutaneous (60454)  Hypertension Assessment/Plan:      The patient's hypertensive risk group is category B: At least one risk factor (excluding diabetes) with no target organ damage.  His calculated 10 year risk of coronary heart disease is 22 %.  Today's blood pressure is 130/80.  His blood pressure goal is < 140/90.  Patient Instructions: 1)  Please schedule a follow-up appointment in 3 months.    Medication Administration  Injection # 1:    Medication: Vit B12 1000 mcg    Diagnosis: B12 DEFICIENCY (ICD-266.2)    Route: IM    Site: R deltoid    Exp Date: 08/27/2011    Lot #: [id number]    Mfr: American Regent    Patient tolerated injection without complications    Given by: Willy Eddy, LPN (November 22, 2009 9:33 AM)  Orders Added: 1)  Flu Vaccine 108yrs + MEDICARE PATIENTS [Q2039] 2)  Administration Flu vaccine - MCR [G0008] 3)  Vit B12 1000 mcg [J3420] 4)  Admin of Therapeutic Inj  intramuscular or subcutaneous [09811]

## 2010-02-26 NOTE — Miscellaneous (Signed)
Summary: [employer]   Imported By: Maryln Gottron 06/28/2009 13:26:44  _____________________________________________________________________  External Attachment:    Type:   Image     Comment:   External Document

## 2010-02-26 NOTE — Assessment & Plan Note (Signed)
Summary: b12 inj/njr  Nurse Visit   Allergies: No Known Drug Allergies  Appended Document: Orders Update    Clinical Lists Changes  Orders: Added new Service order of Vit B12 1000 mcg (Z6109) - Signed Added new Service order of Admin of Therapeutic Inj  intramuscular or subcutaneous (60454) - Signed       Medication Administration  Injection # 1:    Medication: Vit B12 1000 mcg    Diagnosis: B12 DEFICIENCY (ICD-266.2)    Route: IM    Site: L deltoid    Exp Date: 10/26/2010    Lot #: [id number]    Mfr: American Regent    Patient tolerated injection without complications    Given by: Willy Eddy, LPN (April 20, 2009 2:38 PM)  Orders Added: 1)  Vit B12 1000 mcg [J3420] 2)  Admin of Therapeutic Inj  intramuscular or subcutaneous [09811]

## 2010-02-26 NOTE — Assessment & Plan Note (Signed)
Summary: 2 month rov/njr   Vital Signs:  Patient profile:   77 year old male Height:      71 inches Weight:      196 pounds BMI:     27.44 Temp:     98.2 degrees F oral Pulse rate:   80 / minute Resp:     14 per minute BP sitting:   140 / 80  Vitals Entered By: Willy Eddy, LPN (May 25, 2009 9:11 AM) CC: roa, Hypertension Management   CC:  roa and Hypertension Management.  History of Present Illness: Has been on the namemda and aricept has not had the driving test yet for resuming driving privalidges needs this testing done The pt appears more alert with the medications he may be missing the eveing namenda he cannot recall taking any sleeping pills dose remember waking upntoday to watch the "coranation" ( wedding)   Hypertension History:      He denies headache, chest pain, palpitations, dyspnea with exertion, orthopnea, PND, peripheral edema, visual symptoms, neurologic problems, syncope, and side effects from treatment.        Positive major cardiovascular risk factors include male age 2 years old or older, hyperlipidemia, and hypertension.  Negative major cardiovascular risk factors include negative family history for ischemic heart disease and non-tobacco-user status.     Preventive Screening-Counseling & Management  Alcohol-Tobacco     Alcohol drinks/day: 3     Smoking Status: never     Passive Smoke Exposure: no  Problems Prior to Update: 1)  B12 Deficiency  (ICD-266.2) 2)  Hypogonadism  (ICD-257.2) 3)  Polyneuropathy Other Diseases Classified Elsw  (ICD-357.4) 4)  Diverticulosis, Colon  (ICD-562.10) 5)  Carpal Tunnel Syndrome  (ICD-354.0) 6)  Unsteady Gait  (ICD-781.2) 7)  Memory Loss  (ICD-780.93) 8)  Hip Replacement, Total, Hx of  (ICD-V43.64) 9)  Total Knee Replacement, Right, Hx of  (ICD-V43.65) 10)  Hyperlipidemia  (ICD-272.4) 11)  Cerebrovascular Accident, Hx of  (ICD-V12.50) 12)  Family History Diabetes 1st Degree Relative  (ICD-V18.0) 13)   Benign Prostatic Hypertrophy  (ICD-600.00) 14)  Osteoarthritis  (ICD-715.90) 15)  Hypertension  (ICD-401.9)  Current Problems (verified): 1)  B12 Deficiency  (ICD-266.2) 2)  Hypogonadism  (ICD-257.2) 3)  Polyneuropathy Other Diseases Classified Elsw  (ICD-357.4) 4)  Diverticulosis, Colon  (ICD-562.10) 5)  Carpal Tunnel Syndrome  (ICD-354.0) 6)  Unsteady Gait  (ICD-781.2) 7)  Memory Loss  (ICD-780.93) 8)  Hip Replacement, Total, Hx of  (ICD-V43.64) 9)  Total Knee Replacement, Right, Hx of  (ICD-V43.65) 10)  Hyperlipidemia  (ICD-272.4) 11)  Cerebrovascular Accident, Hx of  (ICD-V12.50) 12)  Family History Diabetes 1st Degree Relative  (ICD-V18.0) 13)  Benign Prostatic Hypertrophy  (ICD-600.00) 14)  Osteoarthritis  (ICD-715.90) 15)  Hypertension  (ICD-401.9)  Medications Prior to Update: 1)  Aspirin 81 Mg  Tabs (Aspirin) .... Take 1 Tablet By Mouth Once A Day 2)  Lisinopril 10 Mg  Tabs (Lisinopril) .... Take 1 Tablet By Mouth Once A Day 3)  Restoril 30 Mg  Caps (Temazepam) .... Q Hs As Needed 4)  Namenda 10 Mg Tabs (Memantine Hcl) .... One By Mouth Two Times A Day 5)  Cyanocobalamin 1000 Mcg/ml Soln (Cyanocobalamin) .Marland Kitchen.. 1 Ml Monthly 6)  Aricept 5 Mg Tabs (Donepezil Hcl) .Marland Kitchen.. 1 Once Daily  Current Medications (verified): 1)  Aspirin 81 Mg  Tabs (Aspirin) .... Take 1 Tablet By Mouth Once A Day 2)  Lisinopril 10 Mg  Tabs (Lisinopril) .... Take 1 Tablet  By Mouth Once A Day 3)  Restoril 30 Mg  Caps (Temazepam) .... Q Hs As Needed 4)  Namenda 10 Mg Tabs (Memantine Hcl) .... One By Mouth Two Times A Day 5)  Cyanocobalamin 1000 Mcg/ml Soln (Cyanocobalamin) .Marland Kitchen.. 1 Ml Monthly 6)  Aricept 5 Mg Tabs (Donepezil Hcl) .Marland Kitchen.. 1 Once Daily  Allergies (verified): No Known Drug Allergies  Past History:  Family History: Last updated: 08/31/2006 Family History Diabetes 1st degree relative Family History of Stroke F 1st degree relative <60 Fam hx MI  Social History: Last updated:  08/31/2006 Never Smoked Alcohol use-yes Married  Risk Factors: Alcohol Use: 3 (05/25/2009) Exercise: no (11/03/2007)  Risk Factors: Smoking Status: never (05/25/2009) Passive Smoke Exposure: no (05/25/2009)  Past medical, surgical, family and social histories (including risk factors) reviewed, and no changes noted (except as noted below).  Past Medical History: Reviewed history from 11/03/2007 and no changes required. Hypertension Osteoarthritis Benign prostatic hypertrophy Carpel Tunnel Lipids Cerebrovascular accident, hx of-1988 Hyperlipidemia Diverticulosis, colon  Past Surgical History: Reviewed history from 08/31/2006 and no changes required. Appendectomy Colonoscopy-09/23/2004 Carpal tunnel release Hip replacement-left Knee replacement-right  Family History: Reviewed history from 08/31/2006 and no changes required. Family History Diabetes 1st degree relative Family History of Stroke F 1st degree relative <60 Fam hx MI  Social History: Reviewed history from 08/31/2006 and no changes required. Never Smoked Alcohol use-yes Married  Review of Systems  The patient denies anorexia, fever, weight loss, weight gain, vision loss, decreased hearing, hoarseness, chest pain, syncope, dyspnea on exertion, peripheral edema, prolonged cough, headaches, hemoptysis, abdominal pain, melena, hematochezia, severe indigestion/heartburn, hematuria, incontinence, genital sores, muscle weakness, suspicious skin lesions, transient blindness, difficulty walking, depression, unusual weight change, abnormal bleeding, enlarged lymph nodes, angioedema, and breast masses.    Physical Exam  General:  well-developed and well-nourished.   Head:  normocephalic and male-pattern balding.   Eyes:  pupils equal and pupils round.   Ears:  R ear normal and L ear normal.   Nose:  no external deformity and no nasal discharge.   Mouth:  pharynx pink and moist and fair dentition.   Lungs:  normal  respiratory effort and no wheezes.   Heart:  normal rate, regular rhythm, and Grade   2/6 systolic ejection murmur.   Abdomen:  soft, non-tender, and no masses.   Msk:  decreased ROM.   upper extremity muscle waisting Extremities:  1+ left pedal edema and trace right pedal edema.   Neurologic:  dropped foot on the left  9 due to the hip replacment decreased sensation to PP and decreased sensation to LT.     Impression & Recommendations:  Problem # 1:  UNSTEADY GAIT (ICD-781.2) due to lack of conditioning and missing PT he needs to do this twice a weeks  Problem # 2:  HIP REPLACEMENT, TOTAL, HX OF (ICD-V43.64) related to number one  Problem # 3:  HYPERTENSION (ICD-401.9)  His updated medication list for this problem includes:    Lisinopril 10 Mg Tabs (Lisinopril) .Marland Kitchen... Take 1 tablet by mouth once a day  BP today: 140/80 Prior BP: 122/78 (03/23/2009)  10 Yr Risk Heart Disease: 33 % Prior 10 Yr Risk Heart Disease: 22 % (01/25/2009)  Labs Reviewed: K+: 5.0 (09/04/2008) Creat: : 1.2 (09/04/2008)   Chol: 185 (09/04/2008)   HDL: 41.20 (09/04/2008)   LDL: 137 (11/03/2007)   TG: 93 (11/03/2007)  Orders: TLB-BMP (Basic Metabolic Panel-BMET) (80048-METABOL) Prescription Created Electronically [id number])  Problem # 4:  MEMORY LOSS (ICD-780.93) moniter  for use of the namenda and the aricept  Complete Medication List: 1)  Aspirin 81 Mg Tabs (Aspirin) .... Take 1 tablet by mouth once a day 2)  Lisinopril 10 Mg Tabs (Lisinopril) .... Take 1 tablet by mouth once a day 3)  Restoril 30 Mg Caps (Temazepam) .... Q hs as needed 4)  Namenda 10 Mg Tabs (Memantine hcl) .... One by mouth two times a day 5)  Cyanocobalamin 1000 Mcg/ml Soln (Cyanocobalamin) .Marland Kitchen.. 1 ml monthly 6)  Aricept 5 Mg Tabs (Donepezil hcl) .Marland Kitchen.. 1 once daily  Other Orders: Vit B12 1000 mcg (J3420) Admin of Therapeutic Inj  intramuscular or subcutaneous (13086) Venipuncture (57846) TLB-Cholesterol, HDL  (83718-HDL) TLB-Cholesterol, Direct LDL (83721-DIRLDL) TLB-Cholesterol, Total (82465-CHO) TLB-B12 + Folate Pnl (96295_28413-K44/WNU) TLB-Testosterone, Total (84403-TESTO)  Hypertension Assessment/Plan:      The patient's hypertensive risk group is category B: At least one risk factor (excluding diabetes) with no target organ damage.  His calculated 10 year risk of coronary heart disease is 33 %.  Today's blood pressure is 140/80.  His blood pressure goal is < 140/90.  Patient Instructions: 1)  Please schedule a follow-up appointment in 3 months. Prescriptions: ARICEPT 5 MG TABS (DONEPEZIL HCL) 1 once daily  #30 x 11   Entered and Authorized by:   Stacie Glaze MD   Signed by:   Stacie Glaze MD on 05/25/2009   Method used:   Electronically to        [facility] (retail)       [address]       [place], [place]  [id number]       Ph: [phone] or [phone]       Fax: [phone]   RxID:   [id number] LISINOPRIL 10 MG  TABS (LISINOPRIL) Take 1 tablet by mouth once a day  #30 Tablet x 10   Entered and Authorized by:   Stacie Glaze MD   Signed by:   Stacie Glaze MD on 05/25/2009   Method used:   Electronically to        [facility] (retail)       [address]       [place], [place]  [id number]       Ph: [phone] or [phone]       Fax: [phone]   RxID:   [id number]      Medication Administration  Injection # 1:    Medication: Vit B12 1000 mcg    Diagnosis: B12 DEFICIENCY (ICD-266.2)    Route: IM    Site: R deltoid    Exp Date: 10/26/2010    Lot #: [id number]    Mfr: American Regent    Patient tolerated injection without complications    Given by: Willy Eddy, LPN (May 25, 2009 9:13 AM)  Orders Added: 1)  Vit B12 1000 mcg [J3420] 2)  Admin of Therapeutic Inj  intramuscular or subcutaneous [96372] 3)  Est. Patient Level IV [16967] 4)  Venipuncture [36415] 5)  TLB-Cholesterol, HDL [83718-HDL] 6)  TLB-Cholesterol,  Direct LDL [83721-DIRLDL] 7)  TLB-Cholesterol, Total [82465-CHO] 8)  TLB-B12 + Folate Pnl [82746_82607-B12/FOL] 9)  TLB-Testosterone, Total [84403-TESTO] 10)  TLB-BMP (Basic Metabolic Panel-BMET) [80048-METABOL] 11)  Prescription Created Electronically [id number]

## 2010-02-26 NOTE — Letter (Signed)
Summary: [facility]  [facility]   Imported By: Maryln Gottron 03/09/2009 12:43:24  _____________________________________________________________________  External Attachment:    Type:   Image     Comment:   External Document

## 2010-02-28 NOTE — Assessment & Plan Note (Signed)
Summary: 3 MTH ROV // RS   Vital Signs:  Patient profile:   77 year old male Height:      71 inches Weight:      202 pounds BMI:     28.28 Temp:     98.2 degrees F oral Pulse rate:   76 / minute Resp:     14 per minute BP sitting:   130 / 80  (left arm)  Vitals Entered By: Willy Eddy, LPN (February 21, 2010 9:29 AM) CC: roa- insurance form completion, Hypertension Management Is Patient Diabetic? No   Primary Care Provider:  Stacie Glaze MD  CC:  roa- insurance form completion and Hypertension Management.  History of Present Illness: The pt had a mental status exam and functioning exam today.  patient is an 77 year old white male who presents for evaluation of cognitive functioning and physical functioning.   the patient has had several setbacks in the last 2 years including severe degenerative joint disease of his knees back and hip and decreased mobility with increased fall risk. This has affected his ability to ambulate his ability to transfer and his ability to drive safely. It has also been detected that short-term memory has decreased and today on Mini-Mental status test he did fail 3 out of 3. Medication compliance general care issues are of great concern at this time.  Hypertension History:      He denies headache, chest pain, palpitations, dyspnea with exertion, orthopnea, PND, peripheral edema, visual symptoms, neurologic problems, syncope, and side effects from treatment.        Positive major cardiovascular risk factors include male age 66 years old or older, hyperlipidemia, and hypertension.  Negative major cardiovascular risk factors include negative family history for ischemic heart disease and non-tobacco-user status.     Preventive Screening-Counseling & Management  Alcohol-Tobacco     Alcohol drinks/day: 3     Smoking Status: never     Passive Smoke Exposure: no     Tobacco Counseling: not indicated; no tobacco use  Problems Prior to Update: 1)  B12  Deficiency  (ICD-266.2) 2)  Hypogonadism  (ICD-257.2) 3)  Polyneuropathy Other Diseases Classified [place]  (ICD-357.4) 4)  Diverticulosis, Colon  (ICD-562.10) 5)  Carpal Tunnel Syndrome  (ICD-354.0) 6)  Unsteady Gait  (ICD-781.2) 7)  Memory Loss  (ICD-780.93) 8)  Hip Replacement, Total, Hx of  (ICD-V43.64) 9)  Total Knee Replacement, Right, Hx of  (ICD-V43.65) 10)  Hyperlipidemia  (ICD-272.4) 11)  Cerebrovascular Accident, Hx of  (ICD-V12.50) 12)  Family History Diabetes 1st Degree Relative  (ICD-V18.0) 13)  Benign Prostatic Hypertrophy  (ICD-600.00) 14)  Osteoarthritis  (ICD-715.90) 15)  Hypertension  (ICD-401.9)  Current Problems (verified): 1)  B12 Deficiency  (ICD-266.2) 2)  Hypogonadism  (ICD-257.2) 3)  Polyneuropathy Other Diseases Classified [place]sw  (ICD-357.4) 4)  Diverticulosis, Colon  (ICD-562.10) 5)  Carpal Tunnel Syndrome  (ICD-354.0) 6)  Unsteady Gait  (ICD-781.2) 7)  Memory Loss  (ICD-780.93) 8)  Hip Replacement, Total, Hx of  (ICD-V43.64) 9)  Total Knee Replacement, Right, Hx of  (ICD-V43.65) 10)  Hyperlipidemia  (ICD-272.4) 11)  Cerebrovascular Accident, Hx of  (ICD-V12.50) 12)  Family History Diabetes 1st Degree Relative  (ICD-V18.0) 13)  Benign Prostatic Hypertrophy  (ICD-600.00) 14)  Osteoarthritis  (ICD-715.90) 15)  Hypertension  (ICD-401.9)  Medications Prior to Update: 1)  Aspirin 81 Mg  Tabs (Aspirin) .... Take 1 Tablet By Mouth Once A Day 2)  Lisinopril 10 Mg  Tabs (Lisinopril) .... Take 1  Tablet By Mouth Once A Day 3)  Restoril 30 Mg  Caps (Temazepam) .... Q Hs As Needed 4)  Namenda 10 Mg Tabs (Memantine Hcl) .... One By Mouth Two Times A Day 5)  Cyanocobalamin 1000 Mcg/ml Soln (Cyanocobalamin) .Marland Kitchen.. 1 Ml Monthly 6)  Aricept 5 Mg Tabs (Donepezil Hcl) .Marland Kitchen.. 1 Once Daily  Current Medications (verified): 1)  Aspirin 81 Mg  Tabs (Aspirin) .... Take 1 Tablet By Mouth Once A Day 2)  Lisinopril 10 Mg  Tabs (Lisinopril) .... Take 1 Tablet By Mouth Once A Day 3)   Restoril 30 Mg  Caps (Temazepam) .... Q Hs As Needed 4)  Namenda 10 Mg Tabs (Memantine Hcl) .... One By Mouth Two Times A Day 5)  Cyanocobalamin 1000 Mcg/ml Soln (Cyanocobalamin) .Marland Kitchen.. 1 Ml Monthly 6)  Aricept 5 Mg Tabs (Donepezil Hcl) .Marland Kitchen.. 1 Once Daily  Allergies (verified): No Known Drug Allergies  Past History:  Family History: Last updated: 08/31/2006 Family History Diabetes 1st degree relative Family History of Stroke F 1st degree relative <60 Fam hx MI  Social History: Last updated: 08/31/2006 Never Smoked Alcohol use-yes Married  Risk Factors: Alcohol Use: 3 (02/21/2010) Exercise: no (11/03/2007)  Risk Factors: Smoking Status: never (02/21/2010) Passive Smoke Exposure: no (02/21/2010)  Past medical, surgical, family and social histories (including risk factors) reviewed, and no changes noted (except as noted below).  Past Medical History: Reviewed history from 11/03/2007 and no changes required. Hypertension Osteoarthritis Benign prostatic hypertrophy Carpel Tunnel Lipids Cerebrovascular accident, hx of-1988 Hyperlipidemia Diverticulosis, colon  Past Surgical History: Reviewed history from 08/31/2006 and no changes required. Appendectomy Colonoscopy-09/23/2004 Carpal tunnel release Hip replacement-left Knee replacement-right  Family History: Reviewed history from 08/31/2006 and no changes required. Family History Diabetes 1st degree relative Family History of Stroke F 1st degree relative <60 Fam hx MI  Social History: Reviewed history from 08/31/2006 and no changes required. Never Smoked Alcohol use-yes Married  Review of Systems  The patient denies anorexia, fever, weight loss, weight gain, vision loss, decreased hearing, hoarseness, chest pain, syncope, dyspnea on exertion, peripheral edema, prolonged cough, headaches, hemoptysis, abdominal pain, melena, hematochezia, severe indigestion/heartburn, hematuria, incontinence, genital sores, muscle  weakness, suspicious skin lesions, transient blindness, difficulty walking, depression, unusual weight change, abnormal bleeding, enlarged lymph nodes, angioedema, and breast masses.    Physical Exam  General:  well-developed and well-nourished.   Head:  normocephalic and male-pattern balding.   Eyes:  pupils equal and pupils round.   Ears:  R ear normal and L ear normal.   Nose:  no external deformity and no nasal discharge.   Mouth:  pharynx pink and moist and fair dentition.   Neck:  nuchal rigidity and decreased ROM.   Lungs:  normal respiratory effort and no wheezes.   Heart:  normal rate, regular rhythm, and Grade   2/6 systolic ejection murmur.   Abdomen:  soft, non-tender, and no masses.   Msk:  decreased ROM.   upper extremity muscle waisting Extremities:  1+ left pedal edema and trace right pedal edema.   Neurologic:  alert & oriented X3 and cranial nerves II-XII intact.   Psych:   CC neurologic exam    Mental Status Assessment:  Mental Status Exam: (value/max value)    Orientation to Time: 4/5    Orientation to Place: 4/5    Registration: 1/3    Attention/Calculation: 1/5    Recall: 0/3    Language-name 2 objects: 2/2    Language-follow 3-step command: 2/3  Language-read and follow direction: 1/1    Write a sentence: 1/1    Copy design: 1/1    MSE Total score: 17/30  Clock Drawing Score: 3/4   Detailed Neurologic Exam  Speech:    Speech is normal; fluent and spontaneous with normal comprehension Cognition:    see expanded neurologic exam, short-term memory deficit, and poor attention.   Cranial Nerves:    cranial nerves II-XII intact and pupils equal and reactive to light.   Coordination:    intention tremor.   Gait:    shuffling gait.   Observation:    No asymmetry, no atrophy, and no involuntary movements noted.   Posture:    moderately stooped.   Light Touch:    impaired right lower extremity and impaired left lower extremity.    Proprioception:    impaired right lower extremity and impaired left lower extremity.   Movement Disorder Exam: General:    Tremor head: yes   Impression & Recommendations:  Problem # 1:  DEMENTIA, ADVANCED (ICD-294.8)  advancing cognitive impairment with a Mini-Mental Status Examination score of 17 today your term memory score was zero out of three at 5 minutes forms completed for assessment of cognitive functioning to allow patient to Put in place insurance policy which he has for assistance.  patient is present examination wife is present examination and understand the implications of exam  Problem # 2:  POLYNEUROPATHY OTHER DISEASES CLASSIFIED ELSW (ICD-357.4)  progressive polyneuropathy neuropathy of the lower extremity fracture proprioception and sense of balance combined with the patient's degenerative joint disease fall risk is high  Problem # 3:  OSTEOARTHRITIS (ICD-715.90)   history of multiple joint replacement with progressive weakness transfer difficulty rising from chair arising from the toilet His updated medication list for this problem includes:    Aspirin 81 Mg Tabs (Aspirin) .Marland Kitchen... Take 1 tablet by mouth once a day  Discussed use of medications, application of heat or cold, and exercises.   Complete Medication List: 1)  Aspirin 81 Mg Tabs (Aspirin) .... Take 1 tablet by mouth once a day 2)  Lisinopril 10 Mg Tabs (Lisinopril) .... Take 1 tablet by mouth once a day 3)  Restoril 30 Mg Caps (Temazepam) .... Q hs as needed 4)  Namenda 10 Mg Tabs (Memantine hcl) .... One by mouth two times a day 5)  Cyanocobalamin 1000 Mcg/ml Soln (Cyanocobalamin) .Marland Kitchen.. 1 ml monthly 6)  Aricept 5 Mg Tabs (Donepezil hcl) .Marland Kitchen.. 1 once daily  Other Orders: Vit B12 1000 mcg (J3420) Admin of Therapeutic Inj  intramuscular or subcutaneous (16109)  Hypertension Assessment/Plan:      The patient's hypertensive risk group is category B: At least one risk factor (excluding diabetes) with no  target organ damage.  His calculated 10 year risk of coronary heart disease is 22 %.  Today's blood pressure is 130/80.  His blood pressure goal is < 140/90.  Patient Instructions: 1)   intermediate level care needed 24-hour supervision suggestive   Medication Administration  Injection # 1:    Medication: Vit B12 1000 mcg    Diagnosis: B12 DEFICIENCY (ICD-266.2)    Route: IM    Site: L deltoid    Exp Date: 08/27/2011    Lot #: [id number]    Mfr: [employer] Regent    Patient tolerated injection without complications    Given by: Willy Eddy, LPN (February 21, 2010 9:31 AM)  Orders Added: 1)  Vit B12 1000 mcg [J3420] 2)  Admin of Therapeutic  Inj  intramuscular or subcutaneous [96372] 3)  Est. Patient Level IV [81191]

## 2010-03-06 NOTE — Letter (Signed)
Summary: Attending Physician's Statement/LTC Claims  Attending Physician's Statement/LTC Claims   Imported By: Maryln Gottron 02/28/2010 12:54:19  _____________________________________________________________________  External Attachment:    Type:   Image     Comment:   External Document

## 2010-04-24 ENCOUNTER — Encounter: Payer: Self-pay | Admitting: Internal Medicine

## 2010-04-25 ENCOUNTER — Encounter: Payer: Self-pay | Admitting: Internal Medicine

## 2010-04-25 ENCOUNTER — Ambulatory Visit (INDEPENDENT_AMBULATORY_CARE_PROVIDER_SITE_OTHER): Payer: BC Managed Care – PPO | Admitting: Internal Medicine

## 2010-04-25 VITALS — BP 120/80 | HR 72 | Temp 98.2°F | Resp 14 | Ht 74.0 in | Wt 201.0 lb

## 2010-04-25 DIAGNOSIS — K5730 Diverticulosis of large intestine without perforation or abscess without bleeding: Secondary | ICD-10-CM

## 2010-04-25 DIAGNOSIS — R269 Unspecified abnormalities of gait and mobility: Secondary | ICD-10-CM

## 2010-04-25 DIAGNOSIS — E538 Deficiency of other specified B group vitamins: Secondary | ICD-10-CM

## 2010-04-25 DIAGNOSIS — F068 Other specified mental disorders due to known physiological condition: Secondary | ICD-10-CM

## 2010-04-25 DIAGNOSIS — I10 Essential (primary) hypertension: Secondary | ICD-10-CM

## 2010-04-25 MED ORDER — CYANOCOBALAMIN 1000 MCG/ML IJ SOLN
1000.0000 ug | INTRAMUSCULAR | Status: DC
  Administered 2010-04-25: 1000 ug via INTRAMUSCULAR

## 2010-04-25 NOTE — Assessment & Plan Note (Signed)
Blood pressure is stable 

## 2010-04-25 NOTE — Assessment & Plan Note (Signed)
monitoring of levels needs but he received the b12 shot prior to the visit today So we will plan on a "trough" level " next visit

## 2010-04-25 NOTE — Assessment & Plan Note (Signed)
No colon pain

## 2010-04-25 NOTE — Assessment & Plan Note (Addendum)
Needs to continue with stretching physical therapy this is his greatest risk right now as he is a wide gait is unsteady he uses a cane but has extremely poor balance and weakness in his lower extremities I suspect he has spinal stenosis which contributes to this of course is joint replacement certainly has contributed to this but gait training it is absolutely essential to his safety

## 2010-04-25 NOTE — Progress Notes (Signed)
  Subjective:    Patient ID: Timothy [name], male    DOB: 08-Jul-1924, 77 y.o.   MRN: [id number]  HPI patient is an 77 year old male with hypertension hyperlipidemia memory loss with end-stage dementia and degenerative joint disease with a gait disorder he presents today for followup of his chronic medical problems he has had in-home aide assisting him with his care he states that physical therapy is no longer coming to the house he is by himself today so we are uncertain as to his history because of his dementia.  He denies any chest pain he denies shortness of breath he didn't have any obvious wheezing or edema in his extremities.  He states that he saw Dr. love recently for a neurologic examination    Review of Systems  Constitutional: Positive for activity change and appetite change.  HENT: Negative.   Eyes: Negative.   Respiratory: Negative.   Cardiovascular: Negative.   Gastrointestinal: Negative.   Musculoskeletal: Positive for joint swelling and gait problem.  Skin: Negative.   Neurological: Positive for weakness.  Psychiatric/Behavioral: Positive for confusion and decreased concentration.   Past Medical History  Diagnosis Date  . Hypertension   . Arthritis   . BPH (benign prostatic hypertrophy)   . Carpal tunnel syndrome   . Hyperlipidemia   . Stroke   . Diverticulosis    Past Surgical History  Procedure Date  . Appendectomy   . Carpal tunnel release   . Hip replacement-left   . Joint replacement   . Rt knee replacement     reports that he has never smoked. He does not have any smokeless tobacco history on file. He reports that he drinks alcohol. He reports that he does not use illicit drugs. family history includes Diabetes in his mother; Heart disease in his mother; and Stroke in his mother. Not on File     Objective:   Physical Exam Blood pressure 120/80, pulse 72, temperature 98.2 F (36.8 C), temperature source Oral, resp. rate 14, height 6\' 2"  (1.88  m), weight 201 lb (91.173 kg). On physical examination he is an elderly white male in no apparent distress HEENT which showed arcus senilis pupils were equal round reactive to light and accommodation neck was supple lung fields were clear to auscultation and percussion heart examination regular rhythm with 1/6 systolic murmur abdomen soft bowel sounds are present in all 4 quadrants examination of his extremities about examination and gait shows him to be unsteady balance wide gait and slow movement       Assessment & Plan:

## 2010-04-25 NOTE — Assessment & Plan Note (Signed)
Stable and is followed by Dr Sandria Manly Notes and consult reviewed with pt

## 2010-06-14 NOTE — Discharge Summary (Signed)
[facility]  Patient:    Timothy [name], Timothy [name] Visit Number: [id number] MRN: [id number]          Service Type: SUR Location: [id number] Attending Physician:  Loanne Drilling Dictated by:   Ottie Glazier Wynona Neat, P.A.-C. Admit Date:  05/24/2001 Discharge Date: 05/29/2001                             Discharge Summary  ADMITTING DIAGNOSES: 1. Right knee end-stage osteoarthritis. 2. History of hypertension. 3. History of osteoarthritis. 4. History of "memory disorder."  DISCHARGE DIAGNOSES: 1. Right knee end-stage osteoarthritis, improved. 2. History of hypertension. 3. History of osteoarthritis. 4. History of "memory disorder."  CONSULTATION:  Physical medicine and rehabilitation.  PROCEDURE:  Right total knee arthroplasty.  HISTORY OF PRESENT ILLNESS:  Timothy [name] is a 77 year old, white male who had a long history of right knee pain secondary to end-stage osteoarthritis who had failed conservative management.  Due to his increasing symptomatology, the decision was made to proceed with surgical intervention.  HOSPITAL COURSE:  Timothy [name] was admitted on May 24, 2001, and underwent the above procedure per Dr. Ollen Gross with Avel Peace, P.A.-C assisting. Please see operative report for details.  On postop day #1, the patient was doing well.  He had no complaints.  His pain was well-controlled.  He denied any nausea or vomiting.  His blood pressure was 151/84, pulse 103 and temperature 98.6.  H&H was 12.6 and 35 respectively. Examination of his right knee showed that the dressings were clean, dry and intact.  Motor functions were intact.  Neurovascularly he was intact.  Hemovac was removed without difficulties.  The patient would continue to utilize his PCA for pain control.  We would slowly advance his diet as he tolerated this. Physical therapy and occupational therapy had been consulted to see and evaluate the patient that following  day.  On postop day #2, the patient was having some pain and tenderness to the knee as expected, otherwise he had no complaints.  His bowel sounds were stable. He was afebrile.  H&H now was 11.1 and 31.7.  His admission hemoglobin was 15.4.  Right knee dressings were removed and changed.  Incision was clean, dry and intact without any erythema or discharge.  Skin was intact.  Calves were soft and nontender with good pedal pulses noted.  Motor and neurovascular functions were intact as well.  The patient was noted to be tachycardic at 103.  He did have a history of PVCs and a pulse of 104 on his admit EKG.  His p.o. medications were increased.  If the patient became symptomatic due to tachycardia or if the symptoms persisted or worsened, repeat EKG and possible medicine consult was contemplated.  On postop day #3, the patient was doing well.  His pain was controlled on p.o. pain medications.  Vital signs were stable and he was afebrile.  His pulse was 90 at this point and time.  Therefore, the decision was made to refrain on medicine consult.  His right knee was healing well.  Neurovascular functions were intact.  The patient was progressing with physical therapy and an anticipated discharge for over the weekend had been planned.  Physical medicine and rehabilitation did see and evaluate Timothy [name] and found that per physical therapy, he was progressing quite well with acute level therapies. Therefore, there was the potential that he may not be an appropriate candidate.  Apparently, there were also issues of bed availability as well as obtaining insurance approval for this.  Physical therapy had evaluated the patient and noted that the patient would be a good candidate for home health PT.  On postop day #4, the patient was doing well, however, he was somewhat distraught concerning the inability to be transferred to rehabilitation stating that this wife was out of town a great deal.  We  had a long discussion concerning his progress with physical therapy, the issue of bed availability as well as his insurance company not approving of this stay.  We also discussed his other options concerning self-pay, etc.  Vital signs were stable and afebrile.  His INR was 1.8.  His right knee was examined and neurovascularly intact.  The skin was intact with no erythema or discharge. The remaining part of his hospital stay was benign and progressed well with physical therapy.  Unfortunately, his insurance company would not accommodate a rehabilitation stay and due to the fact that upon his evaluation the patient was progressing at too high of a level for rehabilitation stay, care management worked out home health services with [facility].  On postop day #5, he was up in a chair and doing well.  His pain was controlled and he had no complaints, no nausea, vomiting, fevers, chills or chest pain.  Vital signs were stable.  He was afebrile.  His INR was 2.4. Examination of his right knee showed his incision was clean, dry and intact with no erythema or discharge.  Both neuromotor and vascular functions intact. He had flexion to approximately 78 degrees.  He lacked full extension by approximately 20 degrees.  Due to his improved condition and his plans with [facility] being set, the patient was discharged that day.  DISCHARGE DIAGNOSES: 1. Status post right knee total arthroplasty. 2. History of hypertension. 3. History of osteoarthritis. 4. History of "memory disorder," uncertain to what etiology this is.  CONDITION ON DISCHARGE:  Improved.  DIET:  Regular.  ACTIVITY:  Weightbearing as tolerated.  WOUND CARE:  Keep dressings clean dry and intact.  Shower on postop day #5.  DISCHARGE MEDICATIONS: 1. Percocet 5/325 one to two p.o. q.4-6h. p.r.n. #40. 2. Robaxin 500 mg one p.o. q.6-8h. p.r.n. #40. 3. Coumadin per pharmacy management.  SPECIAL INSTRUCTIONS:  Will be  followed by [facility].  FOLLOWUP:  Follow up with Dr. Lequita Halt in 17 days.  He is to call [phone], for  an appointment, questions or concerns.  He will be managed postoperatively by [facility]. Dictated by:   Ottie Glazier. Wynona Neat, P.A.-C. Attending Physician:  Loanne Drilling DD:  06/16/01 TD:  06/18/01 Job: [id number]

## 2010-06-14 NOTE — Op Note (Signed)
[facility]  Patient:    Timothy [name], Timothy [name] Visit Number: [id number] MRN: [id number]          Service Type: SUR Location: 1S X001 01 Attending Physician:  Loanne Drilling Dictated by:   Ollen Gross, M.D. Proc. Date: 05/24/01 Admit Date:  05/24/2001                             Operative Report  PREOPERATIVE DIAGNOSIS:  Osteoarthritis right knee.  POSTOPERATIVE DIAGNOSIS:  Osteoarthritis right knee.  PROCEDURE:  Right total knee arthroplasty.  SURGEON:  Ollen Gross, M.D.  ASSISTANT:  Alexzandrew L. Perkins, P.A.-C.  ANESTHESIA:  Spinal.  ESTIMATED BLOOD LOSS:  Minimal.  DRAIN:  Hemovac x 1.  COMPLICATIONS:  None.  CONDITION:  Stable to recovery.  BRIEF CLINICAL NOTE:  Mr. Coble is a 77 year old male with severe osteoarthritis of the right knee with a flexion contracture of about 20 degrees and about 15 to 20-degree varus deformity. He has failed nonoperative management and presents now for right total knee arthroplasty.  PROCEDURE IN DETAIL:  After successful administration of spinal anesthetic, a tourniquet is placed high on the right thigh and right lower extremity prepped and draped in the usual sterile fashion. Extremity is wrapped in Esmarch, knee flexed, and tourniquet inflated to 350 mmHg. A standard midline incision is made with a #10 blade through subcutaneous tissue to the level of the extensor mechanism. A fresh blade is used to make a medial parapatellar arthrotomy. Soft tissue over the proximomedial tibia is subperiosteally elevated to the joint line with the knife and into the semimembranosus bursa with a curved osteotome. Soft tissue over the proximolateral tibia is also elevated with attention being paid to avoiding the patellar tendon on the tibial tubercle. Lateral patellofemoral ligament is cut, patella everted, knee flexed 90 degrees. A tremendous degenerative change throughout the knee with large osteophytes  globally as well as cartilage wear throughout. The osteophytes are removed from the intercondylar notch with an osteotome and then ACL and PCL removed. Drill is used to create a starting hole in the distal femur and the canal irrigated. Five degree right valgus alignment guide is placed, referencing off the posterior condyles, rotation is marked and the block pinned to remove 10 mm off the distal femur. Distal femoral resection is made with an oscillating saw. Sizing block is placed and size 5 is most appropriate. Rotation is marked up the epicondylar axis. The size 5 block is pinned and anterior and posterior cuts made.  Tibia is subluxed forward and some more osteophyte removed off the tibial rim. Extramedullary tibial alignment guide is placed referencing proximally at the medial aspect of the tibial tubercle and distally along the second metatarsal axis and tibial crest. This block is subsequently pinned to remove 10 mm off the less deficient lateral side. Tibial resection is made with an oscillating saw. The size 5 is the most appropriate tibial size. The proximal tibia is then prepared with the modular drill and keel punch.  Intercondylar block is then placed on the distal femur, intercondylar and chamfer cuts made. Size 5 posterior stabilized femoral trial with a size 5 mobile bearing tibial tray and a 10 mm posterior stabilized rotating platform with the trial are placed. I could not achieve full extension but he had excellent balance in flexion. I thus needed to remove 2 more millimeters off the distal femur and that is done by placing the femoral block  back on, taking 2 mm off the distal femur and then putting the chamfer block on and then recutting the chamfers and intercondylar cut. I put the trial back on then he achieved full extension with excellent balance throughout. The patella is then everted, osteophytes removed, thickness measured to be 27, free hand resection taken  down to 15 mm and a 41 template is placed, lug holes drilled, trial patella placed and the patella tracks normally. Osteophytes are subsequently removed off the posterior femur with the trial in place. All trials are then removed. Cut bone surfaces are prepared with pulsatile lavage and cement mixed. Once ready for implantation, size 5 posterior stabilized femur, size 5 mobile bearing tibial tray, 41 patella are cemented and the patella is held with the clamp. A 10 mm trial insert is placed, knee held in full extension, all extruded cement removed. The permanent 10 mm rotating platform insert is then placed and the wound copiously irrigated with antibiotic solution. The extensor mechanism is then closed over a Hemovac drain with interrupted #1 PDS, subcutaneous closed with interrupted 2-0 Vicryl, subcuticular with running 4-0 Monocryl. Incision is clean and dry and Steri-Strips and a bulky sterile dressing applied. The patient is then awakened and transported to recovery in stable condition. Dictated by:   Ollen Gross, M.D. Attending Physician:  Loanne Drilling DD:  05/24/01 TD:  05/24/01 Job: [id number]

## 2010-06-14 NOTE — Op Note (Signed)
Timothy [name]                 ACCOUNT NO.:  [id number]   MEDICAL RECORD NO.:  [id number]          PATIENT TYPE:  AMB   LOCATION:  DSC                          FACILITY:  [facility]   PHYSICIAN:  Katy Fitch. Sypher Montez Hageman., M.D.DATE OF BIRTH:  1925/01/23   DATE OF PROCEDURE:  03/26/2004  DATE OF DISCHARGE:                                 OPERATIVE REPORT   PREOPERATIVE DIAGNOSES:  1.  Severe right carpal tunnel syndrome.  2.  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  3.  Generalized sensory motor polyneuropathy.   POSTOPERATIVE DIAGNOSES:  1.  Severe right carpal tunnel syndrome.  2.  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  3.  Generalized sensory motor polyneuropathy.   OPERATION:  1.  Release of right ulnar nerve at cubital tunnel.  2.  Decompression of right median nerve at carpal tunnel.   OPERATING SURGEON:  Katy Fitch. Sypher, M.D.   ASSISTANT:  Jonni Sanger, P.A.   ANESTHESIA:  general by LMA.   SUPERVISING ANESTHESIOLOGIST:  Janetta Hora. Gelene Mink, M.D.   INDICATIONS:  Timothy [name] is a 77 year old man referred by Dr. Darryll Capers  of [facility] for evaluation and management of hand numbness and  thenar muscle atrophy.   Clinical examination revealed a 77 year old gentleman with a history of  significant degenerative disk disease and polysensory motor neuropathy.   He has been extensively evaluated by Dr. Avie Echevaria, attending neurologist,  and has been found to have signs of disk disease and peripheral neuropathy.  Electrodiagnostic studies completed at our office confirmed segmental  conduction block across the wrist and elbow superimposed upon his  polyneuropathy suggesting carpal tunnel syndrome and ulnar nerve entrapment.   Due to a failure to respond to nonoperative measures and due to the severity  of his sensory and motor impairment, he is brought to the operating  at this  time anticipating decompression of his ulnar nerve at the cubital tunnel and  median nerve at the wrist.   Timothy [name] is fully aware that given his history of polyneuropathy, we can  make no guarantees as to the degree of recovery following this procedure.  He understands that this is a best effort to improve the regional  circulation to his nerves at points of known compression in an effort to  optimize his nerve function.  In my judgment he will likely have a better  response to carpal tunnel decompression than he will, ulnar nerve  decompression given the primarily sensory nature of the carpal tunnel  release and primarily motor nature of the impairment at the cubital tunnel.   Questions have been invited and answered. After informed consent he is  brought to the operating room at this time.   PROCEDURE:  Timothy [name] is brought to operating room and placed supine  position on the table.  After a preoperative evaluation protocol performed  by the day surgery center staff and anesthesia consultation by Dr.  Gelene Mink, Timothy [name] was  transferred to room 6, placed in supine position  on the operating table and his right arm placed on an arm table.   Following the induction of general anesthesia by LMA, the right arm was  prepped with Betadine soap and solution and sterilely draped. A pneumatic  tourniquet spot proximal right arm.   Following exsanguination of the right arm an Esmarch bandage, the arterial  the arterial tourniquet was inflated to 120 mmHg.   The procedure commenced with a short incision in the line of the ring finger  and the palm. The subcutaneous tissues were carefully divided revealing a  rather fibrotic palmar fascia. This was split in line of its fibers to  reveal the common sensory branch of the median nerve and superficial palmar  arch distally.   The common sensory branches of the median nerve were followed proximally to  the median nerve proper  which was then gently isolated from the transverse  carpal ligament. The ligament was carefully released with scissors along its  ulnar border extending into the distal forearm.  The volar carpal ligament  was likewise released subcutaneously.  This widely opened carpal canal.  The  ulnar bursa was quite fibrotic and the tenosynovium rather dry.  There were  no masses or other predicaments appreciated.  The wound was then inspected  for bleeding points which were electrocauterized with bipolar current  followed by repair of the skin with intradermal 3-0 Prolene suture.   Attention directed to the medial right elbow.  A 3 cm incision was fashioned  paralleling the path of the ulnar nerve posterior to the epicondyle.  Subcutaneous tissues were carefully divided releasing the fascia and  identifying and gently retracting the posterior branch the medial  antebrachial cutaneous nerve.   The ulnar nerve was isolated posterior to the epicondyle and the arcuate  ligament and fascia at the head of the flexor carpi ulnaris gently released,  decompressing the nerve 6 cm distal to the epicondyle.  The brachial fascia  was likewise released 7 cm proximally.  The floor of the canal was inspected. No masses or osteophytes were noted.  The nerve was noted to be stable to range of motion 0 to 104 degrees of  elbow flexion. There were no other anatomic predicaments noted. This wound  was then repaired with intradermal through Prolene and Steri-Strips.   A compressive dressing was applied with Tegaderm and gauze at the elbow  followed by Ace wrap and a compressive dressing on the hand with a volar  plaster splint maintaining the wrist in 5 degrees dorsiflexion.   Timothy [name] was awakened  from anesthesia and transferred to cover in stable  signs.      RVS/MEDQ  D:  03/26/2004  T:  03/26/2004  Job:  [id number]

## 2010-06-14 NOTE — H&P (Signed)
[facility]  Patient:    Timothy [name], Timothy [name] Visit Number: [id number] MRN: [id number]          Service Type: Attending:  Ollen Gross, M.D. Dictated by:   Ralene Bathe, P.A.                           History and Physical  DATE OF BIRTH:  1924/09/27  CHIEF COMPLAINT:  Right knee pain.  HISTORY OF PRESENT ILLNESS:  This is a 77 year old male who has significant complaints of right knee pain and has known end-stage osteoarthritis.  He has failed outpatient conservative management.  He has bone-on-bone deformity, and it is affecting his activities of daily living.  He is quite active and young for his age, actually.  He has undergone a left total hip arthroplasty and did well with this.  He has previously had a left knee scope.  At this time his right knee pain is significant and affecting his activities of daily living, and he wishes to proceed with right total knee arthroplasty as indicated.  The risks and benefits were discussed with the patient at length, and he is in agreement and wishes to proceed.  He did see his medical physician for preoperative clearance, who is Dr. Darryll Capers.  A letter accompanies him today.  PAST MEDICAL HISTORY: 1. Hypertension. 2. Osteoarthritis. 3. Memory disorder.  PAST SURGICAL HISTORY: 1. Left total hip arthroplasty. 2. Left knee scope. 3. Appendectomy.  MEDICATIONS: 1. Zestril 10 mg q.d. 2. Celebrex 200 mg q.d. 3. Reminyl 5 mg b.i.d., trial for memory. 4. Aspirin 81 mg q.d.  ALLERGIES:  No known drug allergies.  SOCIAL HISTORY:  The patient does not smoke.  He has occasional remote use of alcohol.  Lives with his wife.  He currently still works in Research [employer], [employer], [employer].  He lives in a one-story home with no stairs into the usual entrance.  FAMILY HISTORY:  Mother and father with heart disease in their 71s.  Both died of strokes.  REVIEW OF SYSTEMS:  The patient denies any recent fevers, chills,  night sweats.  No bleeding tendencies.  CNS:  No blurred or double vision.  No headaches, seizures, paralysis.  RESPIRATORY:  No shortness of breath, productive cough, hemoptysis.   CARDIOVASCULAR:  No chest pain, angina, orthopnea.  GASTROINTESTINAL:  No nausea, vomiting, constipation, melena, bloody stools.  GENITOURINARY:  No dysuria, hematuria.  MUSCULOSKELETAL:  As pertinent to present illness.  PHYSICAL EXAMINATION:  GENERAL:  The patient is very well-developed, well-nourished 77 year old male.  VITAL SIGNS:  Blood pressure 152/82.  HEENT:  Normocephalic.  Extraocular motions intact.  NECK:  Supple.  No lymphadenopathy.  No carotid bruits appreciated on exam.  CHEST:  Clear to auscultation bilaterally.  No rales, no rhonchi.  HEART:  Regular rate and rhythm.  No murmurs, gallops, rubs, heaves, or thrills.  ABDOMEN:  Positive bowel sounds.  Soft and nontender.  EXTREMITIES:  Neurovascularly intact to bilateral extremities.  No pitting edema noted.  He has crepitus on range of motion to the right knee and painful joint line tenderness.  IMPRESSION: 1. End-stage osteoarthritis right knee. 2. Osteoarthritis. 3. Hypertension. 4. Memory disorder.  PLAN:  Right total knee arthroplasty.  His postoperative plans are for home, with home health PT and R.N. Dictated by:   Ralene Bathe, P.A. Attending:  Ollen Gross, M.D. DD:  05/18/01 TD:  05/18/01 Job: [id number]

## 2010-07-08 ENCOUNTER — Other Ambulatory Visit: Payer: Self-pay | Admitting: *Deleted

## 2010-07-08 ENCOUNTER — Telehealth: Payer: Self-pay | Admitting: *Deleted

## 2010-07-08 MED ORDER — MEMANTINE HCL 10 MG PO TABS: 10.0000 mg | ORAL_TABLET | Freq: Two times a day (BID) | ORAL | Status: DC

## 2010-07-08 MED ORDER — LISINOPRIL 10 MG PO TABS: 10.0000 mg | ORAL_TABLET | Freq: Every day | ORAL | Status: DC

## 2010-07-08 MED ORDER — DONEPEZIL HCL 5 MG PO TABS: 5.0000 mg | ORAL_TABLET | Freq: Every day | ORAL | Status: DC

## 2010-07-08 NOTE — Telephone Encounter (Signed)
Pt's daughter Tresa Endo) called and is asking for a way that she can get some information on her father.  She lives in [place], and states he is not being taken care of by is wife.  They took him to an Urgent Care, and was given meds and told to see Primary Care.  They have to leave the area tomorrow.  Advised to get the records from the Urgent Care and come here to have their father sign the release for [place] to speak to them.  Make and appt ASAP to see someone here.

## 2010-07-08 NOTE — Telephone Encounter (Signed)
Has ov in am with dr Lovell Sheehan

## 2010-07-09 ENCOUNTER — Ambulatory Visit: Payer: BC Managed Care – PPO | Admitting: Internal Medicine

## 2010-07-09 ENCOUNTER — Encounter: Payer: Self-pay | Admitting: Internal Medicine

## 2010-07-09 ENCOUNTER — Ambulatory Visit (INDEPENDENT_AMBULATORY_CARE_PROVIDER_SITE_OTHER): Payer: Medicare Other | Admitting: Internal Medicine

## 2010-07-09 DIAGNOSIS — D519 Vitamin B12 deficiency anemia, unspecified: Secondary | ICD-10-CM

## 2010-07-09 DIAGNOSIS — D518 Other vitamin B12 deficiency anemias: Secondary | ICD-10-CM

## 2010-07-09 MED ORDER — CYANOCOBALAMIN 1000 MCG/ML IJ SOLN
1000.0000 ug | Freq: Once | INTRAMUSCULAR | Status: AC
  Administered 2010-07-09: 1000 ug via INTRAMUSCULAR

## 2010-07-09 NOTE — Progress Notes (Signed)
  Subjective:    Patient ID: Timothy [name], male    DOB: 04/18/1924, 77 y.o.   MRN: [id number]  HPI Priors an 77 year old white male who presents for followup of hyperlipidemia hypertension and acute gouty attack which he is seen in urgent care this weekend for and chronic organic brain syndrome with dementia.  His also has been diagnosed in the past with hypertension and had been on lisinopril for his blood pressure control he's been apparently off all medications for unknown period of time I would estimate at least a month it is. At times functioning level is decreased. He is at extreme risk for falls due to the weakness he has a broad unstable gait PTU both his severe osteoarthritis complicated by his gallop and his history of spinal stenosis.     Review of Systems  He has a broad-based with   review of systems is significant for elderly male he is no respiratory distress he denies chest pain  Objective:   Physical Exam        Assessment & Plan:  Continue medications

## 2010-07-26 ENCOUNTER — Ambulatory Visit: Payer: BC Managed Care – PPO | Admitting: Internal Medicine

## 2010-08-02 ENCOUNTER — Encounter: Payer: Self-pay | Admitting: Internal Medicine

## 2010-08-02 ENCOUNTER — Ambulatory Visit (INDEPENDENT_AMBULATORY_CARE_PROVIDER_SITE_OTHER): Payer: BC Managed Care – PPO | Admitting: Internal Medicine

## 2010-08-02 DIAGNOSIS — F068 Other specified mental disorders due to known physiological condition: Secondary | ICD-10-CM

## 2010-08-02 DIAGNOSIS — I10 Essential (primary) hypertension: Secondary | ICD-10-CM

## 2010-08-02 DIAGNOSIS — E785 Hyperlipidemia, unspecified: Secondary | ICD-10-CM

## 2010-08-02 NOTE — Patient Instructions (Signed)
Melroy, Follow the instructions of the physical therapist used a wheelchair when appropriate and the walker at other times.  The 3 meals a day and take all the medications that Claris Che puts out for you

## 2010-08-02 NOTE — Progress Notes (Signed)
  Subjective:    Patient ID: Timothy [name], male    DOB: 28-Oct-1924, 77 y.o.   MRN: [id number]  HPI The pt has been evaluated by [facility] home care and will be coming to the house for PT, toilet extender. wheel chair, walker and    Review of Systems  Constitutional: Positive for fatigue. Negative for fever.  HENT: Positive for neck stiffness and postnasal drip. Negative for hearing loss, congestion and neck pain.   Eyes: Negative for discharge, redness and visual disturbance.  Respiratory: Negative for cough, shortness of breath and wheezing.   Cardiovascular: Negative for leg swelling.  Gastrointestinal: Negative for abdominal pain, constipation and abdominal distention.  Genitourinary: Positive for frequency. Negative for urgency.  Musculoskeletal: Negative for joint swelling and arthralgias.  Skin: Negative for color change and rash.  Neurological: Positive for weakness. Negative for light-headedness.  Hematological: Negative for adenopathy.  Psychiatric/Behavioral: Positive for confusion and decreased concentration. Negative for behavioral problems.   Past Medical History  Diagnosis Date  . Hypertension   . Arthritis   . BPH (benign prostatic hypertrophy)   . Carpal tunnel syndrome   . Hyperlipidemia   . Stroke   . Diverticulosis    Past Surgical History  Procedure Date  . Appendectomy   . Carpal tunnel release   . Hip replacement-left   . Joint replacement   . Rt knee replacement     reports that he has never smoked. He does not have any smokeless tobacco history on file. He reports that he drinks alcohol. He reports that he does not use illicit drugs. family history includes Diabetes in his mother; Heart disease in his mother; and Stroke in his mother. No Known Allergies     Objective:   Physical Exam  Constitutional: He appears well-developed and well-nourished.  HENT:  Head: Normocephalic and atraumatic.  Eyes: Conjunctivae are normal. Pupils are equal,  round, and reactive to light.  Neck: Normal range of motion. Neck supple.  Cardiovascular: Normal rate and regular rhythm.   Pulmonary/Chest: Effort normal and breath sounds normal.  Abdominal: Soft. Bowel sounds are normal.  Musculoskeletal: He exhibits edema and tenderness.  Neurological: Coordination abnormal.  Psychiatric: Judgment normal.       Patient mental status is stable with marked cognitive deficit          Assessment & Plan:  Patient shows progression of his dementia and poor judgment.  We spent time with the patient and the patient's caregiver his wife talking about nutrition me to participate in physical therapy and the need to take his medications as scheduled.  We also talked about the risks of falling and how broken hip might be an event that would cause an admission to a nursing home for him.  Ambulation is the key to his continued living at home a home safety evaluation is being done by home health care and appropriate items will be hurt.

## 2010-08-14 ENCOUNTER — Ambulatory Visit (INDEPENDENT_AMBULATORY_CARE_PROVIDER_SITE_OTHER): Payer: BC Managed Care – PPO | Admitting: Internal Medicine

## 2010-08-14 ENCOUNTER — Encounter: Payer: Self-pay | Admitting: Internal Medicine

## 2010-08-14 VITALS — BP 138/80 | HR 72 | Temp 98.2°F | Resp 16 | Ht 74.0 in | Wt 200.0 lb

## 2010-08-14 DIAGNOSIS — Z0000 Encounter for general adult medical examination without abnormal findings: Secondary | ICD-10-CM

## 2010-08-14 DIAGNOSIS — F0390 Unspecified dementia without behavioral disturbance: Secondary | ICD-10-CM

## 2010-08-14 DIAGNOSIS — Z9181 History of falling: Secondary | ICD-10-CM

## 2010-08-14 DIAGNOSIS — Z111 Encounter for screening for respiratory tuberculosis: Secondary | ICD-10-CM

## 2010-08-14 DIAGNOSIS — M25561 Pain in right knee: Secondary | ICD-10-CM

## 2010-08-14 NOTE — Progress Notes (Signed)
  Subjective:    Patient ID: Timothy [name], male    DOB: 14-Feb-1924, 77 y.o.   MRN: [id number]  HPI Patient is an 77 year old white male with multiple medical problems including progressive dementia with an acute decline severe degenerative joint disease with difficulty with transfer and ambulation hypertension and polyneuropathy. There is been an acute decline in his functioning and the decision has been made in coordination with home health care that he needs to be in the memory care unit due to the need for 24-hour supervision and his risk for fall and self injury which are extraordinarily high. He has been accepted at a memory care unit and we feel that this is the appropriate level of care for him   Review of Systems On review of systems his progressive dementia and forgetfulness his ambulation is extremely poor increased arthritic pain stiffness of joints he is continent of stool and urine he has no rashes 1+ edema Rest of review systems is negative    Past Medical History  Diagnosis Date  . Hypertension   . Arthritis   . BPH (benign prostatic hypertrophy)   . Carpal tunnel syndrome   . Hyperlipidemia   . Stroke   . Diverticulosis    Past Surgical History  Procedure Date  . Appendectomy   . Carpal tunnel release   . Hip replacement-left   . Joint replacement   . Rt knee replacement     reports that he has never smoked. He does not have any smokeless tobacco history on file. He reports that he drinks alcohol. He reports that he does not use illicit drugs. family history includes Diabetes in his mother; Heart disease in his mother; and Stroke in his mother. No Known Allergies  Objective:   Physical Exam  Nursing note and vitals reviewed.  patient is an elderly white male with progressive dementia.  He is pleasant and cooperative with this examination.  His gait is wide and unstable he has difficulty arising from a chair.  Neck is supple without JVD or bruit lungs  cells were clear heart examination revealed a regular rate and rhythm with 2/6 systolic murmur abdomen was soft nontender extremity examination reveals 1+ edema neurologic examination reveals him oriented to person and place but not fully understanding situation judgment is poor cognitive ability        Assessment & Plan:  We will place a PPD on his skin today to be read on Friday prior to his admission we have completed the paperwork for his admission to the nursing center for memory care I believe that this is the appropriate level of care for him at this stage of his progressive multifactorial dementia.  He will need physical therapy for strength training transfer training and ambulatory training.  He will be given access to an electric cart due to. to his high fall risk. His medications are listed on this dictation should continue as listed He may continue to come to our office for periodic followup but may also participate in the facility physicians as necessary.

## 2010-08-19 ENCOUNTER — Ambulatory Visit: Payer: BC Managed Care – PPO | Admitting: Internal Medicine

## 2010-08-23 ENCOUNTER — Other Ambulatory Visit: Payer: Self-pay | Admitting: *Deleted

## 2010-08-30 ENCOUNTER — Telehealth: Payer: Self-pay | Admitting: *Deleted

## 2010-08-30 MED ORDER — COLCHICINE 0.6 MG PO TABS: 0.6000 mg | ORAL_TABLET | Freq: Two times a day (BID) | ORAL | Status: DC

## 2010-08-30 NOTE — Telephone Encounter (Signed)
Per dr Yvonne Kendall have colcrys .6 Bid-faxed to nursing to [facility]

## 2010-08-30 NOTE — Telephone Encounter (Signed)
Pt is in assisted living, and has a red and swollen great toe that wife feels is gout.  Nurse does not know if it is infected or if it really is gout.  Fax prescription to [phone] Call [phone] and ask for Joni Reining (nurse)

## 2010-09-04 ENCOUNTER — Telehealth: Payer: Self-pay | Admitting: *Deleted

## 2010-09-04 NOTE — Telephone Encounter (Signed)
Denny Peon, RN would like orders for PT once this week and twice a week for 3 weeks after.  Ok per Dr Lovell Sheehan, verbal order given.

## 2010-10-21 ENCOUNTER — Ambulatory Visit (INDEPENDENT_AMBULATORY_CARE_PROVIDER_SITE_OTHER): Payer: BC Managed Care – PPO | Admitting: Internal Medicine

## 2010-10-21 ENCOUNTER — Encounter: Payer: Self-pay | Admitting: Internal Medicine

## 2010-10-21 VITALS — BP 124/80 | HR 68 | Temp 98.2°F | Resp 16 | Ht 74.0 in | Wt 200.0 lb

## 2010-10-21 DIAGNOSIS — Z23 Encounter for immunization: Secondary | ICD-10-CM

## 2010-10-21 DIAGNOSIS — R413 Other amnesia: Secondary | ICD-10-CM

## 2010-10-21 DIAGNOSIS — Z9181 History of falling: Secondary | ICD-10-CM

## 2010-10-21 DIAGNOSIS — I10 Essential (primary) hypertension: Secondary | ICD-10-CM

## 2010-10-21 NOTE — Progress Notes (Signed)
  Subjective:    Patient ID: Timothy [name], male    DOB: 1924-03-07, 77 y.o.   MRN: [id number]  HPI  Timothy [name] is an 77 year old male who presents for followup of his hypertension osteoarthritis and memory disorder.  He has been in assisted living and has been participating in some exercise he appears to have better muscle strength in his legs and less edema in his legs at this point blood pressure is well controlled he's been compliant with his medications which has significantly helped his overall prognosis and strength  Review of Systems  Constitutional: Negative for fever and fatigue.  HENT: Negative for hearing loss, congestion, neck pain and postnasal drip.   Eyes: Negative for discharge, redness and visual disturbance.  Respiratory: Negative for cough, shortness of breath and wheezing.   Cardiovascular: Negative for leg swelling.  Gastrointestinal: Negative for abdominal pain, constipation and abdominal distention.  Genitourinary: Negative for urgency and frequency.  Musculoskeletal: Negative for joint swelling and arthralgias.  Skin: Negative for color change and rash.  Neurological: Negative for weakness and light-headedness.  Hematological: Negative for adenopathy.  Psychiatric/Behavioral: Negative for behavioral problems.   Past Medical History  Diagnosis Date  . Hypertension   . Arthritis   . BPH (benign prostatic hypertrophy)   . Carpal tunnel syndrome   . Hyperlipidemia   . Stroke   . Diverticulosis    Past Surgical History  Procedure Date  . Appendectomy   . Carpal tunnel release   . Hip replacement-left   . Joint replacement   . Rt knee replacement     reports that he has never smoked. He does not have any smokeless tobacco history on file. He reports that he drinks alcohol. He reports that he does not use illicit drugs. family history includes Diabetes in his mother; Heart disease in his mother; and Stroke in his mother. No Known Allergies      Objective:    Physical Exam  Constitutional: He appears well-developed and well-nourished.  HENT:  Head: Normocephalic and atraumatic.  Eyes: Conjunctivae are normal. Pupils are equal, round, and reactive to light.  Neck: Normal range of motion. Neck supple.  Cardiovascular: Normal rate and regular rhythm.   Pulmonary/Chest: Effort normal and breath sounds normal.  Abdominal: Soft. Bowel sounds are normal.  Musculoskeletal: He exhibits edema.          Assessment & Plan:  Patient's memory disorder stable. His fall risk is high he continues to have significant problems with his left foot. He is able to ambulate but he has difficulty arising from a chair and is still in an assisted living facility because of need.  The dosing of his medications on regular basis has helped his overall peripheral edema hypertension and stamina I think living in the assisted living has also helped his nutritional status. We will see him again in 4 months he was given a flu shot today.

## 2011-02-10 ENCOUNTER — Telehealth: Payer: Self-pay | Admitting: *Deleted

## 2011-02-10 ENCOUNTER — Encounter: Payer: Self-pay | Admitting: *Deleted

## 2011-02-10 NOTE — Telephone Encounter (Signed)
Stop lisinopril- puosh fluids- monitor bp- if bp systolic doesn't go above 100 by tomorrow afternoon or if he has any neurologic changes send to emergency department

## 2011-02-10 NOTE — Telephone Encounter (Signed)
Talked with physical therapist fronicole was told what to do and insntructions were faxed to her at [phone] m gentiva that sent message- she told me to call nurse, nicole at [facility]-

## 2011-02-10 NOTE — Telephone Encounter (Signed)
PT wanted Dr. Lovell Sheehan to know pt's BP is 90/58 on the left, and 86/58 on the right.  HR:  92

## 2011-02-12 ENCOUNTER — Encounter (HOSPITAL_COMMUNITY): Payer: Self-pay | Admitting: *Deleted

## 2011-02-12 ENCOUNTER — Emergency Department (HOSPITAL_COMMUNITY)
Admission: EM | Admit: 2011-02-12 | Discharge: 2011-02-12 | Disposition: A | Discharge status: Home / Self Care | Payer: BC Managed Care – PPO | Attending: Emergency Medicine | Admitting: Emergency Medicine

## 2011-02-12 DIAGNOSIS — Z136 Encounter for screening for cardiovascular disorders: Secondary | ICD-10-CM | POA: Insufficient documentation

## 2011-02-12 DIAGNOSIS — F0390 Unspecified dementia without behavioral disturbance: Secondary | ICD-10-CM | POA: Insufficient documentation

## 2011-02-12 DIAGNOSIS — Z0130 Encounter for examination of blood pressure without abnormal findings: Secondary | ICD-10-CM

## 2011-02-12 DIAGNOSIS — Z8739 Personal history of other diseases of the musculoskeletal system and connective tissue: Secondary | ICD-10-CM | POA: Insufficient documentation

## 2011-02-12 DIAGNOSIS — Z79899 Other long term (current) drug therapy: Secondary | ICD-10-CM | POA: Insufficient documentation

## 2011-02-12 DIAGNOSIS — Z8679 Personal history of other diseases of the circulatory system: Secondary | ICD-10-CM | POA: Insufficient documentation

## 2011-02-12 DIAGNOSIS — I10 Essential (primary) hypertension: Secondary | ICD-10-CM | POA: Insufficient documentation

## 2011-02-12 DIAGNOSIS — E785 Hyperlipidemia, unspecified: Secondary | ICD-10-CM | POA: Insufficient documentation

## 2011-02-12 HISTORY — DX: Unspecified dementia, unspecified severity, without behavioral disturbance, psychotic disturbance, mood disturbance, and anxiety: F03.90

## 2011-02-12 NOTE — ED Notes (Signed)
Pt from [facility], they had been checking BPs for last few days, has been running low. Today was 81/58. Upon ems arrival, BP found to be normal 140/80. Pt has no complaints, no pain, weakness, HA, dizziness. A&O per norm.

## 2011-02-12 NOTE — ED Provider Notes (Signed)
History    86yM sent for evaluation of hypotension. Apparently has been persistently low for past couple days. Pt not sure why here otherwise. Has no complaints currently and says he has been feeling fine past few days. No dizziness, lightheadedness, SOB or CP. Has eating and drinking fine. No unusual swelling. No fever or chills. No n/v/d. No pain anywhere. With at bedside and says husband at baseline.   CSN: [id number]  Arrival date & time 02/12/11  1527   First MD Initiated Contact with Patient 02/12/11 1546      Chief Complaint  Patient presents with  . Hypotension    (Consider location/radiation/quality/duration/timing/severity/associated sxs/prior treatment) HPI  Past Medical History  Diagnosis Date  . Hypertension   . Arthritis   . BPH (benign prostatic hypertrophy)   . Carpal tunnel syndrome   . Hyperlipidemia   . Stroke   . Diverticulosis   . Dementia     Past Surgical History  Procedure Date  . Appendectomy   . Carpal tunnel release   . Hip replacement-left   . Joint replacement   . Rt knee replacement     Family History  Problem Relation Age of Onset  . Stroke Mother   . Heart disease Mother   . Diabetes Mother     History  Substance Use Topics  . Smoking status: Never Smoker   . Smokeless tobacco: Not on file  . Alcohol Use: Yes      Review of Systems   Review of symptoms negative unless otherwise noted in HPI.   Allergies  Review of patient's allergies indicates no known allergies.  Home Medications   Current Outpatient Rx  Name Route Sig Dispense Refill  . CITALOPRAM HYDROBROMIDE 10 MG PO TABS Oral Take 10 mg by mouth daily.    . COLCHICINE 0.6 MG PO TABS Oral Take 1 tablet (0.6 mg total) by mouth 2 (two) times daily. 60 tablet 0  . CYANOCOBALAMIN 1000 MCG/ML IJ SOLN Intramuscular Inject 1,000 mcg into the muscle once.      . DONEPEZIL HCL 10 MG PO TABS Oral Take 10 mg by mouth at bedtime.    Marland Kitchen MEMANTINE HCL 10 MG PO TABS Oral Take  1 tablet (10 mg total) by mouth 2 (two) times daily. 60 tablet 6    BP 127/79  Pulse 69  Temp(Src) 98.5 F (36.9 C) (Oral)  Resp 18  SpO2 99%  Physical Exam  Nursing note and vitals reviewed. Constitutional: He appears well-developed and well-nourished. No distress.       Sitting up in bed. Pleasant and joking around. NAD.  HENT:  Head: Normocephalic and atraumatic.  Eyes: Conjunctivae are normal. Right eye exhibits no discharge. Left eye exhibits no discharge.  Neck: Neck supple.  Cardiovascular: Normal rate, regular rhythm and normal heart sounds.  Exam reveals no gallop and no friction rub.   No murmur heard. Pulmonary/Chest: Effort normal and breath sounds normal. No respiratory distress.  Abdominal: Soft. He exhibits no distension and no mass. There is no tenderness. There is no rebound and no guarding.  Musculoskeletal: He exhibits no tenderness.       Trace symmetric LE edema  Neurological: He is alert.  Skin: Skin is warm and dry. He is not diaphoretic.  Psychiatric: He has a normal mood and affect. His behavior is normal. Thought content normal.    ED Course  Procedures (including critical care time)  Labs Reviewed - No data to display No results found.  1. Blood pressure check       MDM  86yM sent for evaluation of hypotension. Pt with no complaints attributable this. Pt normotensive to slightly hypertensive in ED. 127/79 - 143/82. Consider hypovolemia, infectious, med related, orthostasis, etc but no historical or clinical evidence of. Do not feel testing indicated at this time. Plan for DC and pt/wife in agreement. Return precautions discussed.      Raeford Razor, MD 02/12/11 [id number]

## 2011-02-12 NOTE — ED Notes (Signed)
Bed:WHALA<BR> Expected date:<BR> Expected time:<BR> Means of arrival:<BR> Comments:<BR> hypotension

## 2011-02-20 ENCOUNTER — Encounter: Payer: Self-pay | Admitting: Internal Medicine

## 2011-02-20 ENCOUNTER — Ambulatory Visit (INDEPENDENT_AMBULATORY_CARE_PROVIDER_SITE_OTHER): Payer: BC Managed Care – PPO | Admitting: Internal Medicine

## 2011-02-20 VITALS — BP 120/80 | HR 76 | Temp 98.0°F | Resp 18 | Ht 74.0 in | Wt 190.0 lb

## 2011-02-20 DIAGNOSIS — F068 Other specified mental disorders due to known physiological condition: Secondary | ICD-10-CM

## 2011-02-20 DIAGNOSIS — E291 Testicular hypofunction: Secondary | ICD-10-CM

## 2011-02-20 DIAGNOSIS — M109 Gout, unspecified: Secondary | ICD-10-CM

## 2011-02-20 DIAGNOSIS — T887XXA Unspecified adverse effect of drug or medicament, initial encounter: Secondary | ICD-10-CM

## 2011-02-20 DIAGNOSIS — I10 Essential (primary) hypertension: Secondary | ICD-10-CM

## 2011-02-20 DIAGNOSIS — E785 Hyperlipidemia, unspecified: Secondary | ICD-10-CM

## 2011-02-20 LAB — CBC WITH DIFFERENTIAL/PLATELET
Basophils Absolute: 0.1 10*3/uL (ref 0.0–0.1)
Eosinophils Relative: 2.6 % (ref 0.0–5.0)
HCT: 42 % (ref 39.0–52.0)
Hemoglobin: 13.9 g/dL (ref 13.0–17.0)
Lymphocytes Relative: 32.2 % (ref 12.0–46.0)
MCV: 92.7 fl (ref 78.0–100.0)
Monocytes Absolute: 0.6 10*3/uL (ref 0.1–1.0)
Monocytes Relative: 7.4 % (ref 3.0–12.0)
Neutrophils Relative %: 57.1 % (ref 43.0–77.0)
Platelets: 146 10*3/uL — ABNORMAL LOW (ref 150.0–400.0)
WBC: 7.7 10*3/uL (ref 4.5–10.5)

## 2011-02-20 LAB — BASIC METABOLIC PANEL
CO2: 26 mEq/L (ref 19–32)
Chloride: 108 mEq/L (ref 96–112)
Creatinine, Ser: 1.4 mg/dL (ref 0.4–1.5)
Potassium: 4.8 mEq/L (ref 3.5–5.1)
Sodium: 142 mEq/L (ref 135–145)

## 2011-02-20 NOTE — Progress Notes (Signed)
Subjective:    Patient ID: Timothy [name], male    DOB: [birth year], 78 y.o.   MRN: [id number]  HPI  Timothy [name] is an 78 year old male who is followed for hypertension hyperlipidemia osteoarthritis and progressive dementia he presents today in a pleasant mood he is still having significant problem with gait due to to severe knee arthritis and he walks with a walker.  He denies any shortness of breath PND orthopnea his wife reports that he has had no recent infections cough complaints of dysuria or diarrhea.  He does have a history of gout but has had no recent gouty flares he has had recent hypotension and it was discovered that he was on a salt free diet at his nursing facility.  Recently the lisinopril has been held  Review of Systems  Constitutional: Negative for fever and fatigue.  HENT: Negative for hearing loss, congestion, neck pain and postnasal drip.   Eyes: Negative for discharge, redness and visual disturbance.  Respiratory: Negative for cough, shortness of breath and wheezing.   Cardiovascular: Negative for leg swelling.  Gastrointestinal: Negative for abdominal pain, constipation and abdominal distention.  Genitourinary: Negative for urgency and frequency.  Musculoskeletal: Negative for joint swelling and arthralgias.  Skin: Negative for color change and rash.  Neurological: Negative for weakness and light-headedness.  Hematological: Negative for adenopathy.  Psychiatric/Behavioral: Negative for behavioral problems.   Past Medical History  Diagnosis Date  . Hypertension   . Arthritis   . BPH (benign prostatic hypertrophy)   . Carpal tunnel syndrome   . Hyperlipidemia   . Stroke   . Diverticulosis   . Dementia     History   Social History  . Marital Status: Married    Spouse Name: N/A    Number of Children: N/A  . Years of Education: N/A   Occupational History  . Not on file.   Social History Main Topics  . Smoking status: Never Smoker   .  Smokeless tobacco: Not on file  . Alcohol Use: Yes  . Drug Use: No  . Sexually Active: No   Other Topics Concern  . Not on file   Social History Narrative  . No narrative on file    Past Surgical History  Procedure Date  . Appendectomy   . Carpal tunnel release   . Hip replacement-left   . Joint replacement   . Rt knee replacement     Family History  Problem Relation Age of Onset  . Stroke Mother   . Heart disease Mother   . Diabetes Mother     No Known Allergies  Current Outpatient Prescriptions on File Prior to Visit  Medication Sig Dispense Refill  . citalopram (CELEXA) 10 MG tablet Take 10 mg by mouth daily.      . colchicine 0.6 MG tablet Take 1 tablet (0.6 mg total) by mouth 2 (two) times daily.  60 tablet  0  . cyanocobalamin (,VITAMIN B-12,) 1000 MCG/ML injection Inject 1,000 mcg into the muscle once.        . donepezil (ARICEPT) 10 MG tablet Take 10 mg by mouth at bedtime.      . memantine (NAMENDA) 10 MG tablet Take 1 tablet (10 mg total) by mouth 2 (two) times daily.  60 tablet  6    BP 120/80  Pulse 76  Temp 98 F (36.7 C)  Resp 18  Ht 6\' 2"  (1.88 m)  Wt 190 lb (86.183 kg)  BMI 24.39 kg/m2  Objective:   Physical Exam  Nursing note and vitals reviewed. Constitutional: He appears well-developed and well-nourished.  HENT:  Head: Normocephalic and atraumatic.  Eyes: Conjunctivae are normal. Pupils are equal, round, and reactive to light.  Neck: Normal range of motion. Neck supple.  Cardiovascular: Normal rate and regular rhythm.   Pulmonary/Chest: Effort normal and breath sounds normal.  Abdominal: Soft. Bowel sounds are normal.          Assessment & Plan:  Blood pressure is improved off the lisinopril we discussed liberalizing his diet to include salt and monitoring him off the lisinopril it is possible that he became dehydrated because he was not drinking adequately and the ACE inhibitor prevented him from recovering a salt balance  monitor and a basic metabolic panel today to look at his sodium creatinine and his potassium.  For the time being we will need him off his medication and continue monitoring his blood pressure and we will liberalize his diet

## 2011-02-20 NOTE — Patient Instructions (Addendum)
Please discontinue salt free diet and place patient on regular diet. Discontinue the lisinopril

## 2011-03-28 ENCOUNTER — Other Ambulatory Visit: Payer: Self-pay | Admitting: *Deleted

## 2011-03-28 DIAGNOSIS — M79604 Pain in right leg: Secondary | ICD-10-CM

## 2011-03-28 DIAGNOSIS — M7989 Other specified soft tissue disorders: Secondary | ICD-10-CM

## 2011-04-04 ENCOUNTER — Encounter (INDEPENDENT_AMBULATORY_CARE_PROVIDER_SITE_OTHER): Payer: Medicare Other

## 2011-04-04 DIAGNOSIS — M7989 Other specified soft tissue disorders: Secondary | ICD-10-CM

## 2011-04-04 DIAGNOSIS — M79605 Pain in left leg: Secondary | ICD-10-CM

## 2011-04-17 ENCOUNTER — Telehealth: Payer: Self-pay | Admitting: *Deleted

## 2011-04-17 NOTE — Telephone Encounter (Signed)
COMPRESSION HOSE SCRIPT UP FRONT FOR WIFE TO PICK UP- SHE IS AWARE

## 2011-04-17 NOTE — Telephone Encounter (Signed)
Wife is calling for a prescription for compression stocking. Lives at [facility], and they are suggesting the stockings.  She can pick up the prescription or it would be fine to call it in to [facility].

## 2011-06-13 ENCOUNTER — Other Ambulatory Visit: Payer: Self-pay | Admitting: *Deleted

## 2011-06-13 MED ORDER — MEMANTINE HCL 10 MG PO TABS: 10.0000 mg | ORAL_TABLET | Freq: Two times a day (BID) | ORAL | Status: DC

## 2011-06-13 MED ORDER — COLCHICINE 0.6 MG PO TABS: 0.6000 mg | ORAL_TABLET | Freq: Two times a day (BID) | ORAL | Status: DC

## 2011-06-13 MED ORDER — CITALOPRAM HYDROBROMIDE 10 MG PO TABS: 10.0000 mg | ORAL_TABLET | Freq: Every day | ORAL | Status: DC

## 2011-06-13 MED ORDER — DONEPEZIL HCL 10 MG PO TABS: 10.0000 mg | ORAL_TABLET | Freq: Every day | ORAL | Status: DC

## 2011-07-21 ENCOUNTER — Encounter (HOSPITAL_COMMUNITY): Payer: Self-pay | Admitting: Emergency Medicine

## 2011-07-21 ENCOUNTER — Emergency Department (HOSPITAL_COMMUNITY)
Admission: EM | Admit: 2011-07-21 | Discharge: 2011-07-22 | Disposition: A | Discharge status: Home / Self Care | Payer: Medicare Other | Attending: Emergency Medicine | Admitting: Emergency Medicine

## 2011-07-21 DIAGNOSIS — M25559 Pain in unspecified hip: Secondary | ICD-10-CM | POA: Insufficient documentation

## 2011-07-21 DIAGNOSIS — Z8739 Personal history of other diseases of the musculoskeletal system and connective tissue: Secondary | ICD-10-CM | POA: Insufficient documentation

## 2011-07-21 DIAGNOSIS — I10 Essential (primary) hypertension: Secondary | ICD-10-CM | POA: Insufficient documentation

## 2011-07-21 DIAGNOSIS — IMO0002 Reserved for concepts with insufficient information to code with codable children: Secondary | ICD-10-CM | POA: Insufficient documentation

## 2011-07-21 DIAGNOSIS — F0390 Unspecified dementia without behavioral disturbance: Secondary | ICD-10-CM | POA: Insufficient documentation

## 2011-07-21 DIAGNOSIS — W19XXXA Unspecified fall, initial encounter: Secondary | ICD-10-CM | POA: Insufficient documentation

## 2011-07-21 DIAGNOSIS — Z79899 Other long term (current) drug therapy: Secondary | ICD-10-CM | POA: Insufficient documentation

## 2011-07-21 DIAGNOSIS — Z8673 Personal history of transient ischemic attack (TIA), and cerebral infarction without residual deficits: Secondary | ICD-10-CM | POA: Insufficient documentation

## 2011-07-21 DIAGNOSIS — Z96649 Presence of unspecified artificial hip joint: Secondary | ICD-10-CM | POA: Insufficient documentation

## 2011-07-21 DIAGNOSIS — S0990XA Unspecified injury of head, initial encounter: Secondary | ICD-10-CM | POA: Insufficient documentation

## 2011-07-21 DIAGNOSIS — Y9210 Unspecified residential institution as the place of occurrence of the external cause: Secondary | ICD-10-CM | POA: Insufficient documentation

## 2011-07-21 DIAGNOSIS — M25552 Pain in left hip: Secondary | ICD-10-CM

## 2011-07-21 DIAGNOSIS — E785 Hyperlipidemia, unspecified: Secondary | ICD-10-CM | POA: Insufficient documentation

## 2011-07-21 NOTE — ED Notes (Signed)
Brought in by EMS from [place] at [facility] facility after his fall  tonight. Per EMS, pt was was going to his bedroom when he fell.  Pt presents to ED alert and verbally responsive and appropriate.  Pt has dementia and states that he does not remember falling.  Denies headache, dizziness or nausea.  Denies pain, states "I feel fine". ROM to all extremities adequate.  Has abrasion to middle forehead, bleeding controlled.  No other complaints.

## 2011-07-21 NOTE — ED Notes (Signed)
Bed:WA11<BR> Expected date:<BR> Expected time:<BR> Means of arrival:<BR> Comments:<BR> ems

## 2011-07-22 ENCOUNTER — Emergency Department (HOSPITAL_COMMUNITY): Payer: Medicare Other

## 2011-07-22 LAB — POCT I-STAT, CHEM 8
Calcium, Ion: 1.11 mmol/L — ABNORMAL LOW (ref 1.12–1.32)
Creatinine, Ser: 1.4 mg/dL — ABNORMAL HIGH (ref 0.50–1.35)
Glucose, Bld: 99 mg/dL (ref 70–99)
HCT: 46 % (ref 39.0–52.0)
Hemoglobin: 15.6 g/dL (ref 13.0–17.0)
TCO2: 23 mmol/L (ref 0–100)

## 2011-07-22 MED ORDER — BACITRACIN ZINC 500 UNIT/GM EX OINT
TOPICAL_OINTMENT | CUTANEOUS | Status: AC
  Administered 2011-07-22: 2
  Filled 2011-07-22: qty 1.8

## 2011-07-22 MED ORDER — HYDROCODONE-ACETAMINOPHEN 5-325 MG PO TABS: 1.0000 | ORAL_TABLET | ORAL | Status: AC | PRN

## 2011-07-22 NOTE — ED Notes (Signed)
Pt alert and oriented x4. Respirations even and unlabored, bilateral symmetrical rise and fall of chest. Skin warm and dry. In no acute distress. Denies needs.   ptar arrived

## 2011-07-22 NOTE — ED Provider Notes (Signed)
History     CSN: [id number]  Arrival date & time 07/21/11  2347   First MD Initiated Contact with Patient 07/22/11 0139      Chief Complaint  Patient presents with  . Fall     The history is provided by the patient, a relative, the nursing home and the EMS personnel. History Limited By: level V Caveat: dementia.   the patient was in his normal state of health today when he was found on the ground in his assisted living home.  The son reports that he took his father after a baseball game and that when he took his father home he was very tired.  He sat a walker next to him at some point this evening somewhat found his walker next the bed of the patient lying on the ground.  He is able to get up on his own.  He complains of mild headache and trauma to the left side of his head.  He has a skin tear to his left elbow and also reports mild pain in his left hip.  He has been able to walk since this.  He normally walks with a walker and has a left-sided footdrop. He has dementia  Past Medical History  Diagnosis Date  . Hypertension   . Arthritis   . BPH (benign prostatic hypertrophy)   . Carpal tunnel syndrome   . Hyperlipidemia   . Stroke   . Diverticulosis   . Dementia     Past Surgical History  Procedure Date  . Appendectomy   . Carpal tunnel release   . Hip replacement-left   . Joint replacement   . Rt knee replacement     Family History  Problem Relation Age of Onset  . Stroke Mother   . Heart disease Mother   . Diabetes Mother     History  Substance Use Topics  . Smoking status: Never Smoker   . Smokeless tobacco: Not on file  . Alcohol Use: Yes      Review of Systems  Unable to perform ROS   Allergies  Review of patient's allergies indicates no known allergies.  Home Medications   Current Outpatient Rx  Name Route Sig Dispense Refill  . CITALOPRAM HYDROBROMIDE 10 MG PO TABS Oral Take 1 tablet (10 mg total) by mouth daily. 30 tablet 11  . COLCHICINE 0.6  MG PO TABS Oral Take 1 tablet (0.6 mg total) by mouth 2 (two) times daily. 60 tablet 11  . DONEPEZIL HCL 10 MG PO TABS Oral Take 1 tablet (10 mg total) by mouth at bedtime. 30 tablet 11  . MEMANTINE HCL 10 MG PO TABS Oral Take 1 tablet (10 mg total) by mouth 2 (two) times daily. 60 tablet 11    BP 109/89  Pulse 98  Temp 98.6 F (37 C)  Resp 16  SpO2 96%  Physical Exam  Nursing note and vitals reviewed. Constitutional: He appears well-developed and well-nourished.  HENT:  Head: Normocephalic and atraumatic.  Eyes: EOM are normal.  Neck: Normal range of motion.  Cardiovascular: Normal rate, regular rhythm, normal heart sounds and intact distal pulses.   Pulmonary/Chest: Effort normal and breath sounds normal. No respiratory distress.  Abdominal: Soft. He exhibits no distension. There is no tenderness.  Musculoskeletal: Normal range of motion.       Small skin tear to left elbow.  Full range of motion of left elbow. Full ROM of bilateral hips. Able to ambulate in room, although with  some discomfort on the left  Neurological: He is alert.       Follows simple commands  Skin: Skin is warm and dry.  Psychiatric: He has a normal mood and affect. Judgment normal.    ED Course  Procedures (including critical care time)  Labs Reviewed - No data to display Dg Chest 2 View  07/22/2011  *RADIOLOGY REPORT*  Clinical Data: Fall.  CHEST - 2 VIEW  Comparison: None available  Findings: Shallow inspiration. The heart size and pulmonary vascularity are normal. The lungs appear clear and expanded without focal air space disease or consolidation. No blunting of the costophrenic angles.  Calcified and tortuous aorta.  Degenerative changes in the spine and shoulders.  IMPRESSION: No evidence of active pulmonary disease.  Original Report Authenticated By: Marlon Pel, M.D.   Dg Pelvis 1-2 Views  07/22/2011  *RADIOLOGY REPORT*  Clinical Data: Fall, right hip pain  PELVIS - 1-2 VIEW  Comparison:  Pelvic radiograph dated 07/22/2011  Findings: No fracture or dislocation is seen.  Left total hip arthroplasty.  Mild degenerative changes of the right hip and lower lumbar spine.  IMPRESSION: No fracture or dislocation is seen.  Original Report Authenticated By: Charline Bills, M.D.   Dg Pelvis 1-2 Views  07/22/2011  *RADIOLOGY REPORT*  Clinical Data: Fall.  Previous left hip replacement.  PELVIS - 1-2 VIEW  Comparison: None available.  Findings: Prior left hip arthroplasty with noncemented femoral component.  Heterotopic ossification about the left hip. Degenerative changes in the right hip.  Hypertrophic changes along the femoral head and acetabulum.  No definite fracture is identified, allowing for degenerative changes.  However, impaction fracture is not excluded.  The hip is externally rotated and therefore the femoral neck is not well visualized.  Repeat view is recommended if clinical symptoms in the right hip persist.  The visualized pelvis and sacrum appear intact.  Degenerative changes in the lower lumbar spine.  IMPRESSION: No definitive evidence of any displaced fractures although the right femoral neck is not well demonstrated and remains indeterminate.  Degenerative changes in the lower lumbar spine and right hip.  Postoperative changes in the left hip.  Original Report Authenticated By: Marlon Pel, M.D.   Ct Head Wo Contrast  07/22/2011  *RADIOLOGY REPORT*  Clinical Data: The patient fell this morning with abrasion to the mid forehead.  CT HEAD WITHOUT CONTRAST  Technique:  Contiguous axial images were obtained from the base of the skull through the vertex without contrast.  Comparison: 01/19/2007  Findings: Diffuse cerebral atrophy.  Ventricular dilatation consistent with central atrophy.  Scattered low attenuation changes in the deep white matter consistent with small vessel ischemic change.  No mass effect or midline shift.  No abnormal extra-axial fluid collections.  Gray-white  matter junctions are distinct. Basal cisterns are not effaced.  No evidence of acute intracranial hemorrhage.  Visualized mastoid air cells and paranasal sinuses are not opacified.  No depressed skull fractures.  Vascular calcifications.  No significant changes since the previous study.  IMPRESSION: No acute intracranial abnormalities.  Chronic atrophy and small vessel ischemic changes.  Original Report Authenticated By: Marlon Pel, M.D.    I personally reviewed the imaging tests through PACS system   1. Fall   2. Minor head injury   3. Skin tear   4. Left hip pain       MDM   x-rays of his chest and pelvis were completed and a CT scan of his head were  completed and were without significant abnormalities.  The patient did continue to have some left groin pain and therefore a repeat pelvis film was done with different views to evaluate for possible missed superior / inferior pubic rami fracture on the left.  Repeat images demonstrate no evidence of fracture.  The patient has full range of motion of his bilateral hips.  The patient can ambulate although with some pain.  I suspect the majority of this is contusion.  The patient be discharged home with a prescription for pain medicine and instructions to followup with his orthopedic surgeon.  The family understands to return to there father to the emergency department for new or worsening symptoms        Lyanne Co, MD 07/22/11 [id number]

## 2011-07-22 NOTE — ED Notes (Signed)
Report given to butty, rn at [facility] at [place]

## 2011-07-25 ENCOUNTER — Telehealth: Payer: Self-pay | Admitting: Internal Medicine

## 2011-07-25 DIAGNOSIS — Z96649 Presence of unspecified artificial hip joint: Secondary | ICD-10-CM

## 2011-07-25 NOTE — Telephone Encounter (Signed)
Caller: Counselling psychologist; Phone Number: [phone]; Message from caller: He fell and was in the ED 07-22-11 and was told to follow up with orthopedic MD.  His wife told his caregiver to call Dr Lovell Sheehan nurse to call and set up the orthopedic appt.

## 2011-07-25 NOTE — Telephone Encounter (Signed)
Referral sent to terri and for terri to [employer], wife when appointment is obtained

## 2011-08-27 ENCOUNTER — Ambulatory Visit (INDEPENDENT_AMBULATORY_CARE_PROVIDER_SITE_OTHER): Payer: Medicare Other | Admitting: Internal Medicine

## 2011-08-27 ENCOUNTER — Encounter: Payer: Self-pay | Admitting: Internal Medicine

## 2011-08-27 VITALS — BP 120/82 | Temp 98.0°F

## 2011-08-27 DIAGNOSIS — R269 Unspecified abnormalities of gait and mobility: Secondary | ICD-10-CM

## 2011-08-27 DIAGNOSIS — L0231 Cutaneous abscess of buttock: Secondary | ICD-10-CM

## 2011-08-27 DIAGNOSIS — I10 Essential (primary) hypertension: Secondary | ICD-10-CM

## 2011-08-27 DIAGNOSIS — F068 Other specified mental disorders due to known physiological condition: Secondary | ICD-10-CM

## 2011-08-27 MED ORDER — CEPHALEXIN 500 MG PO CAPS: 500.0000 mg | ORAL_CAPSULE | Freq: Four times a day (QID) | ORAL | Status: AC

## 2011-08-27 NOTE — Patient Instructions (Addendum)
Call or return to clinic prn if these symptoms worsen or fail to improve as anticipated. Abscess Care After An abscess (also called a boil or furuncle) is an infected area that contains a collection of pus. Signs and symptoms of an abscess include pain, tenderness, redness, or hardness, or you may feel a moveable soft area under your skin. An abscess can occur anywhere in the body. The infection may spread to surrounding tissues causing cellulitis. A cut (incision) by the surgeon was made over your abscess and the pus was drained out. Gauze may have been packed into the space to provide a drain that will allow the cavity to heal from the inside outwards. The boil may be painful for 5 to 7 days. Most people with a boil do not have high fevers. Your abscess, if seen early, may not have localized, and may not have been lanced. If not, another appointment may be required for this if it does not get better on its own or with medications. HOME CARE INSTRUCTIONS    Only take over-the-counter or prescription medicines for pain, discomfort, or fever as directed by your caregiver.   When you bathe, soak and then remove gauze or iodoform packs at least daily or as directed by your caregiver. You may then wash the wound gently with mild soapy water. Repack with gauze or do as your caregiver directs.  SEEK IMMEDIATE MEDICAL CARE IF:    You develop increased pain, swelling, redness, drainage, or bleeding in the wound site.   You develop signs of generalized infection including muscle aches, chills, fever, or a general ill feeling.   An oral temperature above 102 F (38.9 C) develops, not controlled by medication.  See your caregiver for a recheck if you develop any of the symptoms described above. If medications (antibiotics) were prescribed, take them as directed. Document Released: 08/01/2004 Document Revised: 01/02/2011 Document Reviewed: 03/29/2007 [facility] Patient Information 2012 [place], [place].  Clean  and dress once daily

## 2011-08-27 NOTE — Progress Notes (Signed)
  Subjective:    Patient ID: Timothy [name], male    DOB: September 01, 1924, 78 y.o.   MRN: [id number]  HPI  78 year old patient who has a history of dementia and gait instability who is brought here today by his wife from an extended care facility due to a large and erythematous nodule involving his left buttock area. There has been no apparent drainage. No drug allergies    Review of Systems  Skin: Positive for wound.       Objective:   Physical Exam  Constitutional: He appears well-developed and well-nourished. No distress.       Unsteady gait. Requires 2 person assistance to stand and to transfer  Skin:       6 cm area of erythema in the left mid buttock area. Centrally there was a firm nodule that was not fluctuant.          Assessment & Plan:   Abscess/cellulitis left buttock area. After local prep with Betadine and local anesthesia with 1% Xylocaine an incision and drainage was performed. A modest amount of pus expressed. Blunt dissection was performed. The wound was treated with topical antibiotic ointment and dressed.

## 2011-09-04 ENCOUNTER — Telehealth: Payer: Self-pay | Admitting: Internal Medicine

## 2011-09-04 NOTE — Telephone Encounter (Signed)
Pease advise - dr. Amador Cunas out of office until 8/19

## 2011-09-04 NOTE — Telephone Encounter (Signed)
Pt was in to see Dr Amador Cunas on 08/27/11 re: a boil on pts bottock. Genevieve Norlander says that the area is now hard to the touch, red and painful. Pls call.

## 2011-09-04 NOTE — Telephone Encounter (Signed)
Per dr Lovell Sheehan-  omnicef  300 mg bid for 10 days- stop keflex-- this script was faxed to [place] [facility] and [facility] nurse was informed

## 2011-10-03 ENCOUNTER — Telehealth: Payer: Self-pay | Admitting: Internal Medicine

## 2011-10-03 NOTE — Telephone Encounter (Signed)
Left a message for return call.  

## 2011-10-03 NOTE — Telephone Encounter (Signed)
Caller: Heather/Other; Phone: [phone]; Reason for Call: Caller: Herbert Seta RN calling from [facility]; Patient Name: Timothy [name]; PCP: Eleonore Chiquito [facility]); Best Callback Phone Number: [phone].    Heather RN calling from [facility], requesting an order for 6 Physical Therapy visits, twice weekly X 3 weeks, to work on balance and transfer.  Patient is a resident at [facility].  PLEASE RETURN CALL TO HEATHER RN, [facility], AT [phone].

## 2011-10-03 NOTE — Telephone Encounter (Signed)
ok 

## 2011-10-06 NOTE — Telephone Encounter (Signed)
I have attempted both numbers left - neither indicated heather ot [facility] - no msg left. KIK

## 2011-12-08 ENCOUNTER — Telehealth: Payer: Self-pay | Admitting: Internal Medicine

## 2011-12-08 NOTE — Telephone Encounter (Signed)
Please let son know that I faxed it on 10-28 and will refax it today

## 2011-12-08 NOTE — Telephone Encounter (Signed)
Notified pts son as noted.

## 2011-12-08 NOTE — Telephone Encounter (Signed)
Pts son called and said that he spoke with the physical therapist re: an order that was faxed to Dr Lovell Sheehan for continuance of Physical Therapy about 2 wks ago. The therapist told the son that they have not gotten a response yet. Would like to know status.

## 2012-03-30 ENCOUNTER — Telehealth: Payer: Self-pay | Admitting: Internal Medicine

## 2012-03-30 NOTE — Telephone Encounter (Signed)
Please let them know dr Lovell Sheehan is aware

## 2012-03-30 NOTE — Telephone Encounter (Signed)
Timothy [name] states pt's heartbeat is irregular. She is wondering if pt has a history of this or is this new. Pt is not symptomatic. Pt feels fine. Pt in assisted living facility. BP 106/60  p 70

## 2012-04-02 ENCOUNTER — Emergency Department (HOSPITAL_COMMUNITY): Payer: Medicare Other

## 2012-04-02 ENCOUNTER — Encounter (HOSPITAL_COMMUNITY): Payer: Self-pay | Admitting: Emergency Medicine

## 2012-04-02 ENCOUNTER — Emergency Department (HOSPITAL_COMMUNITY)
Admission: EM | Admit: 2012-04-02 | Discharge: 2012-04-03 | Disposition: A | Discharge status: Home / Self Care | Payer: Medicare Other | Attending: Emergency Medicine | Admitting: Emergency Medicine

## 2012-04-02 ENCOUNTER — Telehealth: Payer: Self-pay | Admitting: Family Medicine

## 2012-04-02 DIAGNOSIS — Z8739 Personal history of other diseases of the musculoskeletal system and connective tissue: Secondary | ICD-10-CM | POA: Insufficient documentation

## 2012-04-02 DIAGNOSIS — Y9210 Unspecified residential institution as the place of occurrence of the external cause: Secondary | ICD-10-CM | POA: Insufficient documentation

## 2012-04-02 DIAGNOSIS — E785 Hyperlipidemia, unspecified: Secondary | ICD-10-CM | POA: Insufficient documentation

## 2012-04-02 DIAGNOSIS — Z8673 Personal history of transient ischemic attack (TIA), and cerebral infarction without residual deficits: Secondary | ICD-10-CM | POA: Insufficient documentation

## 2012-04-02 DIAGNOSIS — F068 Other specified mental disorders due to known physiological condition: Secondary | ICD-10-CM | POA: Insufficient documentation

## 2012-04-02 DIAGNOSIS — Z8719 Personal history of other diseases of the digestive system: Secondary | ICD-10-CM | POA: Insufficient documentation

## 2012-04-02 DIAGNOSIS — Z87448 Personal history of other diseases of urinary system: Secondary | ICD-10-CM | POA: Insufficient documentation

## 2012-04-02 DIAGNOSIS — Z7982 Long term (current) use of aspirin: Secondary | ICD-10-CM | POA: Insufficient documentation

## 2012-04-02 DIAGNOSIS — I10 Essential (primary) hypertension: Secondary | ICD-10-CM | POA: Insufficient documentation

## 2012-04-02 DIAGNOSIS — IMO0002 Reserved for concepts with insufficient information to code with codable children: Secondary | ICD-10-CM | POA: Insufficient documentation

## 2012-04-02 DIAGNOSIS — Z8669 Personal history of other diseases of the nervous system and sense organs: Secondary | ICD-10-CM | POA: Insufficient documentation

## 2012-04-02 DIAGNOSIS — Y939 Activity, unspecified: Secondary | ICD-10-CM | POA: Insufficient documentation

## 2012-04-02 LAB — CBC WITH DIFFERENTIAL/PLATELET
Basophils Absolute: 0 10*3/uL (ref 0.0–0.1)
Basophils Relative: 0 % (ref 0–1)
Eosinophils Absolute: 0.1 10*3/uL (ref 0.0–0.7)
MCH: 30.5 pg (ref 26.0–34.0)
MCHC: 33.6 g/dL (ref 30.0–36.0)
Neutro Abs: 5.3 10*3/uL (ref 1.7–7.7)
Neutrophils Relative %: 73 % (ref 43–77)
RDW: 14.9 % (ref 11.5–15.5)

## 2012-04-02 LAB — URINALYSIS, ROUTINE W REFLEX MICROSCOPIC
Bilirubin Urine: NEGATIVE
Ketones, ur: NEGATIVE mg/dL
Leukocytes, UA: NEGATIVE
Nitrite: NEGATIVE
Specific Gravity, Urine: 1.02 (ref 1.005–1.030)
Urobilinogen, UA: 0.2 mg/dL (ref 0.0–1.0)
pH: 5 (ref 5.0–8.0)

## 2012-04-02 LAB — BASIC METABOLIC PANEL
BUN: 30 mg/dL — ABNORMAL HIGH (ref 6–23)
Chloride: 108 mEq/L (ref 96–112)
Creatinine, Ser: 1.42 mg/dL — ABNORMAL HIGH (ref 0.50–1.35)
GFR calc Af Amer: 50 mL/min — ABNORMAL LOW (ref >90)
GFR calc non Af Amer: 43 mL/min — ABNORMAL LOW (ref >90)
Potassium: 4.8 mEq/L (ref 3.5–5.1)

## 2012-04-02 NOTE — ED Notes (Signed)
Patient transported to X-ray 

## 2012-04-02 NOTE — Telephone Encounter (Signed)
On-call note: call received from Numy at [facility].  Pt fell.  Pt was taken to ED.  Numy asked for a call-back at [phone]. I got a voice mail and left message to call me back if I was needed but I got no call-back.

## 2012-04-02 NOTE — ED Provider Notes (Signed)
Medical screening examination/treatment/procedure(s) were conducted as a shared visit with non-physician practitioner(s) and myself.  I personally evaluated the patient during the encounter  Dementia patient with Abrasion to R hand from wall after walking with walker.  No fall.  Did not hit head or LOC. Moving all extremities. Appears to be at baseline.  Glynn Octave, MD 04/02/12 [phone]

## 2012-04-02 NOTE — ED Notes (Signed)
Pt back from x-ray.

## 2012-04-02 NOTE — ED Provider Notes (Signed)
History     CSN: [id number]  Arrival date & time 04/02/12  1945   First MD Initiated Contact with Patient 04/02/12 2001      Chief Complaint  Patient presents with  . Abrasion    (Consider location/radiation/quality/duration/timing/severity/associated sxs/prior treatment) HPI Comments: This is an 79 year old male, past medical history remarkable for hypertension, stroke, hyperlipidemia, and dementia, who presents emergency department with chief complaint of abrasion. Per EMS, the patient was found hanging onto his Khayman Kirsch near the wall. Patient denies losing consciousness, or falling. He is alert and oriented at this time. He denies any pain anywhere. He denies chest pain, shortness of breath, nausea, vomiting, diarrhea, constipation, numbness and tingling of the extremities. EMS 12-lead EKG showed multifocal PVCs.   The history is provided by the patient. No language interpreter was used.    Past Medical History  Diagnosis Date  . Hypertension   . Arthritis   . BPH (benign prostatic hypertrophy)   . Carpal tunnel syndrome   . Hyperlipidemia   . Stroke   . Diverticulosis   . Dementia     Past Surgical History  Procedure Laterality Date  . Appendectomy    . Carpal tunnel release    . Hip replacement-left    . Joint replacement    . Rt knee replacement      Family History  Problem Relation Age of Onset  . Stroke Mother   . Heart disease Mother   . Diabetes Mother     History  Substance Use Topics  . Smoking status: Never Smoker   . Smokeless tobacco: Not on file  . Alcohol Use: Yes      Review of Systems  All other systems reviewed and are negative.    Allergies  Review of patient's allergies indicates no known allergies.  Home Medications   Current Outpatient Rx  Name  Route  Sig  Dispense  Refill  . aspirin 81 MG tablet   Oral   Take 81 mg by mouth daily as needed for pain.           BP 101/66  Pulse 85  Temp(Src) 97.8 F (36.6 C) (Oral)   Resp 18  SpO2 94%  Physical Exam  Nursing note and vitals reviewed. Constitutional: He is oriented to person, place, and time. He appears well-developed and well-nourished.  HENT:  Head: Normocephalic and atraumatic.  Right Ear: External ear normal.  Left Ear: External ear normal.  Nose: Nose normal.  Mouth/Throat: Oropharynx is clear and moist. No oropharyngeal exudate.  Eyes: Conjunctivae and EOM are normal. Pupils are equal, round, and reactive to light. Right eye exhibits no discharge. Left eye exhibits no discharge. No scleral icterus.  Neck: Normal range of motion. Neck supple. No JVD present.  Cardiovascular: Normal rate, regular rhythm, normal heart sounds and intact distal pulses.  Exam reveals no gallop and no friction rub.   No murmur heard. Pulmonary/Chest: Effort normal and breath sounds normal. No respiratory distress. He has no wheezes. He has no rales. He exhibits no tenderness.  Abdominal: Soft. Bowel sounds are normal. He exhibits no distension and no mass. There is no tenderness. There is no rebound and no guarding.  Musculoskeletal: Normal range of motion. He exhibits no edema and no tenderness.  Sensation and strength intact bilaterally  Neurological: He is alert and oriented to person, place, and time.  CN 3-12 intact  Skin: Skin is warm and dry.  Skin tear an abrasion with considerable bruising of right  posterior hand, bleeding is controlled  Psychiatric: He has a normal mood and affect. His behavior is normal. Judgment and thought content normal.    ED Course  Procedures (including critical care time)  Labs Reviewed  CBC WITH DIFFERENTIAL  BASIC METABOLIC PANEL  URINALYSIS, ROUTINE W REFLEX MICROSCOPIC   Results for orders placed during the hospital encounter of 04/02/12  CBC WITH DIFFERENTIAL      Result Value Range   WBC 7.4  4.0 - 10.5 K/uL   RBC 4.76  4.22 - 5.81 MIL/uL   Hemoglobin 14.5  13.0 - 17.0 g/dL   HCT 16.1  09.6 - 04.5 %   MCV 90.8  78.0  - 100.0 fL   MCH 30.5  26.0 - 34.0 pg   MCHC 33.6  30.0 - 36.0 g/dL   RDW 40.9  81.1 - 91.4 %   Platelets 122 (*) 150 - 400 K/uL   Neutrophils Relative 73  43 - 77 %   Neutro Abs 5.3  1.7 - 7.7 K/uL   Lymphocytes Relative 18  12 - 46 %   Lymphs Abs 1.3  0.7 - 4.0 K/uL   Monocytes Relative 7  3 - 12 %   Monocytes Absolute 0.5  0.1 - 1.0 K/uL   Eosinophils Relative 2  0 - 5 %   Eosinophils Absolute 0.1  0.0 - 0.7 K/uL   Basophils Relative 0  0 - 1 %   Basophils Absolute 0.0  0.0 - 0.1 K/uL  BASIC METABOLIC PANEL      Result Value Range   Sodium 143  135 - 145 mEq/L   Potassium 4.8  3.5 - 5.1 mEq/L   Chloride 108  96 - 112 mEq/L   CO2 23  19 - 32 mEq/L   Glucose, Bld 113 (*) 70 - 99 mg/dL   BUN 30 (*) 6 - 23 mg/dL   Creatinine, Ser 7.82 (*) 0.50 - 1.35 mg/dL   Calcium 9.2  8.4 - 95.6 mg/dL   GFR calc non Af Amer 43 (*) >90 mL/min   GFR calc Af Amer 50 (*) >90 mL/min  URINALYSIS, ROUTINE W REFLEX MICROSCOPIC      Result Value Range   Color, Urine YELLOW  YELLOW   APPearance CLEAR  CLEAR   Specific Gravity, Urine 1.020  1.005 - 1.030   pH 5.0  5.0 - 8.0   Glucose, UA NEGATIVE  NEGATIVE mg/dL   Hgb urine dipstick NEGATIVE  NEGATIVE   Bilirubin Urine NEGATIVE  NEGATIVE   Ketones, ur NEGATIVE  NEGATIVE mg/dL   Protein, ur NEGATIVE  NEGATIVE mg/dL   Urobilinogen, UA 0.2  0.0 - 1.0 mg/dL   Nitrite NEGATIVE  NEGATIVE   Leukocytes, UA NEGATIVE  NEGATIVE  POCT I-STAT TROPONIN I      Result Value Range   Troponin i, poc 0.06  0.00 - 0.08 ng/mL   Comment 3            Dg Wrist Complete Right  04/02/2012  *RADIOLOGY REPORT*  Clinical Data: Right wrist abrasions.  RIGHT WRIST - COMPLETE 3+ VIEW  Comparison: Right middle finger radiographs performed 01/19/2007  Findings: There is no evidence of fracture or dislocation.  The carpal rows are intact, and demonstrate normal alignment. Degenerative joint space narrowing is noted at the radial aspect of the carpal rows.  There is calcification  of the triangular fibrocartilage.  Diffuse vascular calcifications are seen.  No significant soft tissue abnormalities are seen.  IMPRESSION:  1.  No evidence of fracture or dislocation. 2.  Degenerative change at the radial aspect of the carpal rows. 3.  Calcification of the triangular fibrocartilage. 4.  Diffuse vascular calcifications seen.   Original Report Authenticated By: Tonia Ghent, M.D.    Dg Hand Complete Right  04/02/2012  *RADIOLOGY REPORT*  Clinical Data: Right hand and wrist bruising and  RIGHT HAND - COMPLETE 3+ VIEW  Comparison: Right middle finger radiographs performed 01/19/2007  Findings: There is no evidence of fracture or dislocation.  There is mild degenerative change at the radial aspect of the carpal rows.  Remaining visualized joint spaces are grossly preserved. There is calcification of the triangular fibrocartilage.  Diffuse vascular calcifications are seen.  IMPRESSION:  1.  No evidence of fracture or dislocation. 2.  Mild degenerative change at the radial aspect of the carpal rows. 3.  Calcification behind the thyroid cartilage. 4.  Diffuse vascular calcifications seen.   Original Report Authenticated By: Tonia Ghent, M.D.      ED ECG REPORT  I personally interpreted this EKG   Date: 04/02/2012   Rate: 83  Rhythm: normal sinus rhythm  QRS Axis: normal  Intervals: normal  ST/T Wave abnormalities: nonspecific T wave changes  Conduction Disutrbances:none  Narrative Interpretation:   Old EKG Reviewed: none available    1. Abrasion       MDM  This is an 79 year old male with an abrasion.  Patient scraped his hand against the wall.  He denies falling, however, he does not know how he scraped his hand.  He has a history of dementia.  This patient has been discussed with and seen by Dr. Manus Gunning.  Labs are reassuring.  Will discharge with PCP follow up.  No fractures seen on the plain films.  Dr. Manus Gunning agrees that he may be discharged at this time.  Patient is  stable and ready for discharge.  He remains pain free.        Roxy Horseman, PA-C 04/02/12 2212

## 2012-04-02 NOTE — ED Notes (Signed)
Pt arrived by EMS from [facility] nursing facility. Pt was in the bathroom and when he got up with walker he went forward and leaned against his walker and the wall. Pt has a small ST to right hand. No LOC or c/o pain. EMS 12 lead showed multifocal PVCs and staff was unaware if normal for pt.

## 2012-04-03 NOTE — ED Notes (Signed)
Pt given snacks and water.

## 2012-05-26 ENCOUNTER — Other Ambulatory Visit: Payer: Self-pay | Admitting: *Deleted

## 2012-05-26 ENCOUNTER — Telehealth: Payer: Self-pay | Admitting: Internal Medicine

## 2012-05-26 MED ORDER — DONEPEZIL HCL 10 MG PO TABS: 10.0000 mg | ORAL_TABLET | Freq: Every evening | ORAL | Status: DC | PRN

## 2012-05-26 MED ORDER — COLCHICINE 0.6 MG PO TABS: 0.6000 mg | ORAL_TABLET | Freq: Two times a day (BID) | ORAL | Status: DC

## 2012-05-26 MED ORDER — CITALOPRAM HYDROBROMIDE 10 MG PO TABS: 10.0000 mg | ORAL_TABLET | Freq: Every day | ORAL | Status: DC

## 2012-05-26 NOTE — Telephone Encounter (Signed)
Pharm needs clarification on rx for donepezil (ARICEPT) 10 MG tablet. New script states " at bedtime as needed."  Previous RX  " one at  bedtime" Pls clarify if this needs to be prn. Pls call pharmacy.

## 2012-05-27 ENCOUNTER — Other Ambulatory Visit: Payer: Self-pay | Admitting: *Deleted

## 2012-05-27 ENCOUNTER — Telehealth: Payer: Self-pay | Admitting: Internal Medicine

## 2012-05-27 MED ORDER — MOXIFLOXACIN HCL 400 MG PO TABS: 400.0000 mg | ORAL_TABLET | Freq: Every day | ORAL | Status: DC

## 2012-05-27 MED ORDER — HYDROCODONE-HOMATROPINE 5-1.5 MG/5ML PO SYRP: 5.0000 mL | ORAL_SOLUTION | Freq: Three times a day (TID) | ORAL | Status: DC | PRN

## 2012-05-27 NOTE — Telephone Encounter (Signed)
Resident c/o coughing and L flank and L side chest pain d/t cough - nurse doesn't think it is cardiac. Also has runny nose. He does not have anything ordered PRN for coughing/pain. Please call her back to advise and give order.  Nurse states that they already faxed a paper yesterday requesting these things w/ no response.

## 2012-05-27 NOTE — Telephone Encounter (Signed)
Per dr Lovell Sheehan- ua and cxr please-  Left message on machine For [facility] at [facility]

## 2012-05-27 NOTE — Telephone Encounter (Signed)
Called ph armacy and it is 1 qhs

## 2012-05-27 NOTE — Telephone Encounter (Signed)
xrays was possible pneumonia- rx by dr Lovell Sheehan- avelox 400 1 qd for 10 days and  Hycodan cough med 5 ml q8 hrs prn- script faxed to facility and talked with nicole that received the fax

## 2012-05-28 ENCOUNTER — Encounter (HOSPITAL_COMMUNITY): Payer: Self-pay | Admitting: *Deleted

## 2012-05-28 ENCOUNTER — Telehealth: Payer: Self-pay | Admitting: Internal Medicine

## 2012-05-28 ENCOUNTER — Inpatient Hospital Stay (HOSPITAL_COMMUNITY)
Admission: EM | Admit: 2012-05-28 | Discharge: 2012-06-01 | DRG: 175 | Disposition: A | Discharge status: Skilled Nursing Facility | Payer: Medicare Other | Attending: Internal Medicine | Admitting: Internal Medicine

## 2012-05-28 ENCOUNTER — Emergency Department (HOSPITAL_COMMUNITY): Payer: Medicare Other

## 2012-05-28 DIAGNOSIS — I4729 Other ventricular tachycardia: Secondary | ICD-10-CM | POA: Diagnosis not present

## 2012-05-28 DIAGNOSIS — F0390 Unspecified dementia without behavioral disturbance: Secondary | ICD-10-CM | POA: Diagnosis present

## 2012-05-28 DIAGNOSIS — K859 Acute pancreatitis without necrosis or infection, unspecified: Secondary | ICD-10-CM | POA: Diagnosis present

## 2012-05-28 DIAGNOSIS — G63 Polyneuropathy in diseases classified elsewhere: Secondary | ICD-10-CM

## 2012-05-28 DIAGNOSIS — M1990 Unspecified osteoarthritis, unspecified site: Secondary | ICD-10-CM

## 2012-05-28 DIAGNOSIS — F068 Other specified mental disorders due to known physiological condition: Secondary | ICD-10-CM | POA: Diagnosis present

## 2012-05-28 DIAGNOSIS — N400 Enlarged prostate without lower urinary tract symptoms: Secondary | ICD-10-CM | POA: Diagnosis present

## 2012-05-28 DIAGNOSIS — Z96649 Presence of unspecified artificial hip joint: Secondary | ICD-10-CM

## 2012-05-28 DIAGNOSIS — I714 Abdominal aortic aneurysm, without rupture, unspecified: Secondary | ICD-10-CM | POA: Diagnosis present

## 2012-05-28 DIAGNOSIS — K5730 Diverticulosis of large intestine without perforation or abscess without bleeding: Secondary | ICD-10-CM | POA: Diagnosis present

## 2012-05-28 DIAGNOSIS — Z79899 Other long term (current) drug therapy: Secondary | ICD-10-CM

## 2012-05-28 DIAGNOSIS — R109 Unspecified abdominal pain: Secondary | ICD-10-CM | POA: Diagnosis present

## 2012-05-28 DIAGNOSIS — I429 Cardiomyopathy, unspecified: Secondary | ICD-10-CM

## 2012-05-28 DIAGNOSIS — D696 Thrombocytopenia, unspecified: Secondary | ICD-10-CM | POA: Diagnosis present

## 2012-05-28 DIAGNOSIS — E785 Hyperlipidemia, unspecified: Secondary | ICD-10-CM | POA: Diagnosis present

## 2012-05-28 DIAGNOSIS — I10 Essential (primary) hypertension: Secondary | ICD-10-CM | POA: Diagnosis present

## 2012-05-28 DIAGNOSIS — I472 Ventricular tachycardia, unspecified: Secondary | ICD-10-CM | POA: Diagnosis not present

## 2012-05-28 DIAGNOSIS — Z96659 Presence of unspecified artificial knee joint: Secondary | ICD-10-CM

## 2012-05-28 DIAGNOSIS — I2699 Other pulmonary embolism without acute cor pulmonale: Principal | ICD-10-CM | POA: Diagnosis present

## 2012-05-28 DIAGNOSIS — R931 Abnormal findings on diagnostic imaging of heart and coronary circulation: Secondary | ICD-10-CM

## 2012-05-28 DIAGNOSIS — J189 Pneumonia, unspecified organism: Secondary | ICD-10-CM | POA: Diagnosis present

## 2012-05-28 HISTORY — DX: Other complications of anesthesia, initial encounter: T88.59XA

## 2012-05-28 HISTORY — DX: Adverse effect of unspecified anesthetic, initial encounter: T41.45XA

## 2012-05-28 LAB — COMPREHENSIVE METABOLIC PANEL
ALT: 14 U/L (ref 0–53)
AST: 14 U/L (ref 0–37)
Albumin: 3.1 g/dL — ABNORMAL LOW (ref 3.5–5.2)
CO2: 22 mEq/L (ref 19–32)
CO2: 24 mEq/L (ref 19–32)
Calcium: 8.8 mg/dL (ref 8.4–10.5)
Chloride: 99 mEq/L (ref 96–112)
Creatinine, Ser: 1.3 mg/dL (ref 0.50–1.35)
Creatinine, Ser: 1.32 mg/dL (ref 0.50–1.35)
GFR calc Af Amer: 56 mL/min — ABNORMAL LOW (ref >90)
GFR calc non Af Amer: 48 mL/min — ABNORMAL LOW (ref >90)
GFR calc non Af Amer: 47 mL/min — ABNORMAL LOW (ref >90)
Glucose, Bld: 128 mg/dL — ABNORMAL HIGH (ref 70–99)
Potassium: 4.2 mEq/L (ref 3.5–5.1)
Sodium: 136 mEq/L (ref 135–145)
Sodium: 135 mEq/L (ref 135–145)
Total Bilirubin: 0.8 mg/dL (ref 0.3–1.2)
Total Protein: 6.4 g/dL (ref 6.0–8.3)

## 2012-05-28 LAB — PHOSPHORUS: Phosphorus: 3.2 mg/dL (ref 2.3–4.6)

## 2012-05-28 LAB — URINALYSIS, ROUTINE W REFLEX MICROSCOPIC
Bilirubin Urine: NEGATIVE
Ketones, ur: NEGATIVE mg/dL
Nitrite: NEGATIVE
Urobilinogen, UA: 1 mg/dL (ref 0.0–1.0)

## 2012-05-28 LAB — CBC WITH DIFFERENTIAL/PLATELET
Basophils Relative: 0 % (ref 0–1)
Basophils Relative: 0 % (ref 0–1)
Eosinophils Absolute: 0.1 10*3/uL (ref 0.0–0.7)
Eosinophils Relative: 1 % (ref 0–5)
HCT: 44.4 % (ref 39.0–52.0)
Hemoglobin: 14.2 g/dL (ref 13.0–17.0)
Lymphocytes Relative: 29 % (ref 12–46)
MCH: 29.2 pg (ref 26.0–34.0)
MCHC: 32.1 g/dL (ref 30.0–36.0)
MCHC: 32 g/dL (ref 30.0–36.0)
MCV: 91.2 fL (ref 78.0–100.0)
Monocytes Absolute: 0.8 10*3/uL (ref 0.1–1.0)
Monocytes Relative: 8 % (ref 3–12)
Monocytes Relative: 8 % (ref 3–12)
Neutro Abs: 5.9 10*3/uL (ref 1.7–7.7)
Neutrophils Relative %: 67 % (ref 43–77)
Platelets: 104 10*3/uL — ABNORMAL LOW (ref 150–400)
RDW: 14.6 % (ref 11.5–15.5)

## 2012-05-28 LAB — LIPASE, BLOOD: Lipase: 73 U/L — ABNORMAL HIGH (ref 11–59)

## 2012-05-28 LAB — PROTIME-INR
INR: 1.21 (ref 0.00–1.49)
Prothrombin Time: 15.1 seconds (ref 11.6–15.2)

## 2012-05-28 LAB — OCCULT BLOOD, POC DEVICE: Fecal Occult Bld: NEGATIVE

## 2012-05-28 MED ORDER — IOHEXOL 300 MG/ML  SOLN
100.0000 mL | Freq: Once | INTRAMUSCULAR | Status: AC | PRN
  Administered 2012-05-28: 100 mL via INTRAVENOUS

## 2012-05-28 MED ORDER — ENOXAPARIN SODIUM 100 MG/ML ~~LOC~~ SOLN
90.0000 mg | Freq: Two times a day (BID) | SUBCUTANEOUS | Status: DC
  Administered 2012-05-28 – 2012-05-31 (×6): 90 mg via SUBCUTANEOUS
  Filled 2012-05-28 (×8): qty 1

## 2012-05-28 MED ORDER — LEVOFLOXACIN 500 MG PO TABS: 750.0000 mg | ORAL_TABLET | Freq: Every day | ORAL | Status: DC

## 2012-05-28 MED ORDER — HYDROCODONE-HOMATROPINE 5-1.5 MG/5ML PO SYRP: 5.0000 mL | ORAL_SOLUTION | Freq: Three times a day (TID) | ORAL | Status: DC | PRN

## 2012-05-28 MED ORDER — SODIUM CHLORIDE 0.9 % IV SOLN
INTRAVENOUS | Status: DC
  Administered 2012-05-28 – 2012-05-31 (×5): via INTRAVENOUS

## 2012-05-28 MED ORDER — SODIUM CHLORIDE 0.9 % IJ SOLN
3.0000 mL | Freq: Two times a day (BID) | INTRAMUSCULAR | Status: DC
  Administered 2012-05-28 – 2012-05-31 (×5): 3 mL via INTRAVENOUS

## 2012-05-28 MED ORDER — MEMANTINE HCL 10 MG PO TABS
10.0000 mg | ORAL_TABLET | Freq: Two times a day (BID) | ORAL | Status: DC
  Administered 2012-05-28 – 2012-06-01 (×8): 10 mg via ORAL
  Filled 2012-05-28 (×9): qty 1

## 2012-05-28 MED ORDER — ACETAMINOPHEN 650 MG RE SUPP: 650.0000 mg | Freq: Four times a day (QID) | RECTAL | Status: DC | PRN

## 2012-05-28 MED ORDER — ONDANSETRON HCL 4 MG/2ML IJ SOLN: 4.0000 mg | Freq: Four times a day (QID) | INTRAMUSCULAR | Status: DC | PRN

## 2012-05-28 MED ORDER — MORPHINE SULFATE 2 MG/ML IJ SOLN
1.0000 mg | INTRAMUSCULAR | Status: DC | PRN
  Administered 2012-05-28: 1 mg via INTRAVENOUS
  Filled 2012-05-28: qty 1

## 2012-05-28 MED ORDER — DONEPEZIL HCL 10 MG PO TABS
10.0000 mg | ORAL_TABLET | Freq: Every day | ORAL | Status: DC
  Administered 2012-05-28 – 2012-05-31 (×4): 10 mg via ORAL
  Filled 2012-05-28 (×5): qty 1

## 2012-05-28 MED ORDER — LEVOFLOXACIN IN D5W 750 MG/150ML IV SOLN
750.0000 mg | INTRAVENOUS | Status: DC
  Administered 2012-05-28: 750 mg via INTRAVENOUS
  Filled 2012-05-28: qty 150

## 2012-05-28 MED ORDER — WARFARIN - PHARMACIST DOSING INPATIENT: Freq: Every day | Status: DC

## 2012-05-28 MED ORDER — COLCHICINE 0.6 MG PO TABS
0.6000 mg | ORAL_TABLET | Freq: Two times a day (BID) | ORAL | Status: DC
  Administered 2012-05-28 – 2012-06-01 (×8): 0.6 mg via ORAL
  Filled 2012-05-28 (×9): qty 1

## 2012-05-28 MED ORDER — WARFARIN SODIUM 2.5 MG PO TABS
2.5000 mg | ORAL_TABLET | Freq: Once | ORAL | Status: AC
  Administered 2012-05-28: 2.5 mg via ORAL
  Filled 2012-05-28: qty 1

## 2012-05-28 MED ORDER — IOHEXOL 300 MG/ML  SOLN
50.0000 mL | Freq: Once | INTRAMUSCULAR | Status: AC | PRN
  Administered 2012-05-28: 50 mL via ORAL

## 2012-05-28 MED ORDER — ONDANSETRON HCL 4 MG PO TABS: 4.0000 mg | ORAL_TABLET | Freq: Four times a day (QID) | ORAL | Status: DC | PRN

## 2012-05-28 MED ORDER — CITALOPRAM HYDROBROMIDE 10 MG PO TABS
10.0000 mg | ORAL_TABLET | Freq: Every day | ORAL | Status: DC
  Administered 2012-05-29 – 2012-06-01 (×4): 10 mg via ORAL
  Filled 2012-05-28 (×4): qty 1

## 2012-05-28 MED ORDER — SENNOSIDES-DOCUSATE SODIUM 8.6-50 MG PO TABS
1.0000 | ORAL_TABLET | Freq: Every evening | ORAL | Status: DC | PRN
  Filled 2012-05-28: qty 1

## 2012-05-28 MED ORDER — ACETAMINOPHEN 325 MG PO TABS: 650.0000 mg | ORAL_TABLET | Freq: Four times a day (QID) | ORAL | Status: DC | PRN

## 2012-05-28 NOTE — ED Provider Notes (Signed)
History     CSN: [id number]  Arrival date & time 05/28/12  1320   First MD Initiated Contact with Patient 05/28/12 1500    Level V caveat dementia  Chief Complaint  Patient presents with  . Abdominal Pain    (Consider location/radiation/quality/duration/timing/severity/associated sxs/prior treatment) Patient is a 79 y.o. male presenting with abdominal pain.  Abdominal Pain Associated symptoms: cough    History is obtained from patient's daughter who accompanies him, visiting from [place] and Anne Hahn, nurse pack at the assisted-living facility via telephone . The patient has complained of abdominal pain since 05/26/2012. He points to the left side of his abdomen and intermittently say "OW!" Other symptoms include cough, no fever. He was evaluated with chest x-ray and urinalysis and started on Avelox. His daughter also reports that she was less alert today and less jovial today however since arriving here he is acting as his normal self per his daughter. Past Medical History  Diagnosis Date  . Hypertension   . Arthritis   . BPH (benign prostatic hypertrophy)   . Carpal tunnel syndrome   . Hyperlipidemia   . Stroke   . Diverticulosis   . Dementia     Past Surgical History  Procedure Laterality Date  . Appendectomy    . Carpal tunnel release    . Hip replacement-left    . Joint replacement    . Rt knee replacement      Family History  Problem Relation Age of Onset  . Stroke Mother   . Heart disease Mother   . Diabetes Mother     History  Substance Use Topics  . Smoking status: Never Smoker   . Smokeless tobacco: Not on file  . Alcohol Use: Yes      Review of Systems  Unable to perform ROS: Dementia  Respiratory: Positive for cough.   Gastrointestinal: Positive for abdominal pain.    Allergies  Review of patient's allergies indicates no known allergies.  Home Medications   Current Outpatient Rx  Name  Route  Sig  Dispense  Refill  . citalopram  (CELEXA) 10 MG tablet   Oral   Take 1 tablet (10 mg total) by mouth daily.   30 tablet   6   . colchicine 0.6 MG tablet   Oral   Take 1 tablet (0.6 mg total) by mouth 2 (two) times daily.   60 tablet   6   . donepezil (ARICEPT) 10 MG tablet   Oral   Take 10 mg by mouth at bedtime.          Marland Kitchen HYDROcodone-homatropine (HYCODAN) 5-1.5 MG/5ML syrup   Oral   Take 5 mLs by mouth every 8 (eight) hours as needed for cough.   120 mL   0   . memantine (NAMENDA) 10 MG tablet   Oral   Take 10 mg by mouth 2 (two) times daily.         Marland Kitchen moxifloxacin (AVELOX) 400 MG tablet   Oral   Take 400 mg by mouth daily. 10 day course started 05/27/12.           BP 103/70  Pulse 83  Temp(Src) 98.2 F (36.8 C) (Oral)  Resp 16  SpO2 96%  Physical Exam  Nursing note and vitals reviewed. Constitutional: He appears well-developed and well-nourished. No distress.  HENT:  Head: Normocephalic and atraumatic.  Eyes: Conjunctivae are normal. Pupils are equal, round, and reactive to light.  Neck: Neck supple. No tracheal  deviation present. No thyromegaly present.  Cardiovascular: Normal rate and regular rhythm.   No murmur heard. Pulmonary/Chest: Effort normal and breath sounds normal.  Abdominal: Soft. Bowel sounds are normal. He exhibits no distension and no mass. There is tenderness. There is no rebound and no guarding.  Tender at left upper quadrant and left lower quadrants  Genitourinary: Rectum normal, prostate normal and penis normal. Guaiac negative stool.  Musculoskeletal: Normal range of motion. He exhibits no edema and no tenderness.  Neurological: He is alert. Coordination normal.  Jovial, well interactive follow simple commands moves all extremities alert  Skin: Skin is warm and dry. No rash noted.  Psychiatric: He has a normal mood and affect.    ED Course  Procedures (including critical care time)  Labs Reviewed - No data to display No results found.   No diagnosis  found.  Results for orders placed during the hospital encounter of 05/28/12  COMPREHENSIVE METABOLIC PANEL      Result Value Range   Sodium 136  135 - 145 mEq/L   Potassium 4.4  3.5 - 5.1 mEq/L   Chloride 102  96 - 112 mEq/L   CO2 22  19 - 32 mEq/L   Glucose, Bld 128 (*) 70 - 99 mg/dL   BUN 31 (*) 6 - 23 mg/dL   Creatinine, Ser 4.09  0.50 - 1.35 mg/dL   Calcium 8.8  8.4 - 81.1 mg/dL   Total Protein 6.4  6.0 - 8.3 g/dL   Albumin 3.0 (*) 3.5 - 5.2 g/dL   AST 14  0 - 37 U/L   ALT 14  0 - 53 U/L   Alkaline Phosphatase 102  39 - 117 U/L   Total Bilirubin 0.7  0.3 - 1.2 mg/dL   GFR calc non Af Amer 48 (*) >90 mL/min   GFR calc Af Amer 56 (*) >90 mL/min  URINALYSIS, ROUTINE W REFLEX MICROSCOPIC      Result Value Range   Color, Urine YELLOW  YELLOW   APPearance CLEAR  CLEAR   Specific Gravity, Urine 1.034 (*) 1.005 - 1.030   pH 5.0  5.0 - 8.0   Glucose, UA NEGATIVE  NEGATIVE mg/dL   Hgb urine dipstick NEGATIVE  NEGATIVE   Bilirubin Urine NEGATIVE  NEGATIVE   Ketones, ur NEGATIVE  NEGATIVE mg/dL   Protein, ur 30 (*) NEGATIVE mg/dL   Urobilinogen, UA 1.0  0.0 - 1.0 mg/dL   Nitrite NEGATIVE  NEGATIVE   Leukocytes, UA NEGATIVE  NEGATIVE  CBC WITH DIFFERENTIAL      Result Value Range   WBC 9.2  4.0 - 10.5 K/uL   RBC 4.59  4.22 - 5.81 MIL/uL   Hemoglobin 13.4  13.0 - 17.0 g/dL   HCT 91.4  78.2 - 95.6 %   MCV 91.1  78.0 - 100.0 fL   MCH 29.2  26.0 - 34.0 pg   MCHC 32.1  30.0 - 36.0 g/dL   RDW 21.3  08.6 - 57.8 %   Platelets 104 (*) 150 - 400 K/uL   Neutrophils Relative 67  43 - 77 %   Neutro Abs 6.2  1.7 - 7.7 K/uL   Lymphocytes Relative 25  12 - 46 %   Lymphs Abs 2.3  0.7 - 4.0 K/uL   Monocytes Relative 8  3 - 12 %   Monocytes Absolute 0.7  0.1 - 1.0 K/uL   Eosinophils Relative 1  0 - 5 %   Eosinophils Absolute 0.1  0.0 -  0.7 K/uL   Basophils Relative 0  0 - 1 %   Basophils Absolute 0.0  0.0 - 0.1 K/uL  LIPASE, BLOOD      Result Value Range   Lipase 73 (*) 11 - 59 U/L   URINE MICROSCOPIC-ADD ON      Result Value Range   Squamous Epithelial / LPF FEW (*) RARE   Bacteria, UA FEW (*) RARE   Urine-Other MUCOUS PRESENT    OCCULT BLOOD, POC DEVICE      Result Value Range   Fecal Occult Bld NEGATIVE  NEGATIVE   Dg Chest 2 View  05/28/2012  *RADIOLOGY REPORT*  Clinical Data: Abdominal pain  CHEST - 2 VIEW  Comparison: 07/22/2011  Findings: Cardiomediastinal silhouette is stable.  There is small left pleural effusion with left basilar atelectasis or infiltrate. No pulmonary edema.  IMPRESSION: .  Small left pleural effusion with left basilar atelectasis or infiltrate.  No pulmonary edema.   Original Report Authenticated By: Natasha Mead, M.D.    Ct Abdomen Pelvis W Contrast  05/28/2012  *RADIOLOGY REPORT*  Clinical Data: Check, left upper quadrant pain, change in level of consciousness, abdominal distention, decreased bowel sounds, past history hypertension  CT ABDOMEN AND PELVIS WITH CONTRAST  Technique:  Multidetector CT imaging of the abdomen and pelvis was performed following the standard protocol during bolus administration of intravenous contrast. Sagittal and coronal MPR images reconstructed from axial data set.  Contrast: OMNIPAQUE IOHEXOL 300 MG/ML  SOLN Dilute oral contrast.  Comparison: None  Findings: Small left pleural effusion with bibasilar atelectasis and suspect small focus of left basilar infiltrate. Large filling defect identified at bifurcation of right pulmonary artery consistent with a large pulmonary embolus. Questionable small filling defect within a left lower lobe pulmonary artery versus artifact, incompletely assessed, images 1- 2.  Liver, spleen, atrophic pancreas, and kidneys unremarkable. Mildly thickened bilateral adrenal glands without discrete nodule. Extensive atherosclerotic calcifications with aneurysmal dilatation of the distal abdominal aorta up to 4.4 x 4.0 cm image 48, approximately 8.4 cm length. Aneurysm extends to but not through  aortic bifurcation. No evidence of aneurysm leak, perianeurysmal infiltration or fibrosis.  Bilateral inguinal hernias containing fat. Well-distended unremarkable bladder. Portions of pelvis obscured by beam hardening artifacts from a left hip prosthesis. Prostatic enlargement gland with 6.0 x 5.0 by 5.6 cm. Sigmoid diverticulosis without evidence of diverticulitis. Upper normal-sized left common iliac lymph node 10 mm short axis image 52.  No additional mass, adenopathy, free fluid or inflammatory process. Short segment of the mid transverse colon shows questionable wall thickening versus artifact from underdistension. Stomach remaining bowel loops normal appearance. Severe multilevel degenerative disc and facet disease changes lumbar spine.  IMPRESSION: Large pulmonary embolus at the bifurcation of the right pulmonary artery.  4.4 x 4.0 x 8.4 cm distal abdominal aortic aneurysm without evidence of rupture/leak. Sigmoid diverticulosis without evidence of diverticulitis. Small left pleural effusion with bibasilar atelectasis and probable small focus of infiltrate left lower lobe.  Critical Value/emergent results were called by telephone at the time of interpretation on 05/28/2012 at 1657 hours to Dr. Ethelda Chick, who verbally acknowledged these results.   Original Report Authenticated By: Ulyses Southward, M.D.      MDM  Spoke with Dr. Elisabeth Pigeon plan admit telemetry. Lovenox Diagnosis #1 pulmonary embolism #2 thrombocytopenia #3 aortic aneurysm        Doug Sou, MD 05/28/12 1610

## 2012-05-28 NOTE — ED Notes (Signed)
Per EMS pt coming from [facility] nursing facility with c/o LUQ abdominal pain and change in LOC. Pt has hx of dementia. Per EMS abdomen is distended and pt has decreased bowel sounds. VSS

## 2012-05-28 NOTE — ED Notes (Signed)
[id number]> Expected date:<BR> Expected time:<BR> Means of arrival:<BR> Comments:<BR> 79yo-weakness/abd pain/demintia

## 2012-05-28 NOTE — Telephone Encounter (Signed)
Per dr Lovell Sheehan- make hycodan 5 ml q 6 hours around the clock for 5 days.also give mucinex 600 bid for days-this was on script and faxed to [email or web address]. Talked with pt's nurse and she states she received order and it was explained to her

## 2012-05-28 NOTE — H&P (Signed)
[facility] History and Physical  Timothy [name] ZOX:[id number] DOB: 1924-03-13 DOA: 05/28/2012  Referring physician: ER physician PCP: Timothy Mew, MD   Chief Complaint: abdominal pain, cough  HPI:  79 year old male with past medical history significant for dementia, from nursing home, how presented to [facility] ED 05/28/2012 with worsening abdominal pain for past 1 day prior to this admission. Patient is not a good historian due to his history of dementia. History is provided from the ED physician. Family is not at the bedside at the moment to provide history either. There was apparently associated cough reported in NH but no fever although patient was on avelox in NH for possible pneumonia. Evaluation in ED included CT abdomen/pelvis which was significant for diverticulosis but not diverticulitis. More importantly, there was a significant finding of large pulmonary embolism iat the bifurcation of right pulmonary artery. Chest x ray showed left basilar infiltrate. Patient was started on Lovenox in ED.  Assessment and Plan:  Principal Problem:   Abdominal  pain, other specified site - likely due to diverticulosis and pancreatitis - lipase level was elevated at 73 on this admission. Follow up lipase level in am - continue IV fluids, antiemetics and analgesia with morphine 1 mg Q 4 hours PRN IV - may advance diet as toelrated Active Problems:   Acute Pancreatitis - management as above   DEMENTIA, ADVANCED - continue namenda and aricept   Pulmonary embolism   - lovenox and coumadin per pharmacy - watch for signs of bleed   CAP (community acquired pneumonia) - started levaquin 750 mg IV daily  [name] [phone]  Review of Systems:   Constitutional: Negative for fever, chills and malaise/fatigue. Negative for diaphoresis.  HENT: Negative for hearing loss, ear pain, nosebleeds, congestion, sore throat, neck pain, tinnitus and ear discharge.   Eyes: Negative for  blurred vision, double vision, photophobia, pain, discharge and redness.  Respiratory: positive for cough, no hemoptysis, sputum production, shortness of breath, wheezing and stridor.   Cardiovascular: Negative for chest pain, palpitations, orthopnea, claudication and leg swelling.  Gastrointestinal: Negative for nausea, vomiting and  Positive for abdominal pain. Negative for heartburn, constipation, blood in stool and melena.  Genitourinary: Negative for dysuria, urgency, frequency, hematuria and flank pain.  Musculoskeletal: Negative for myalgias, back pain, joint pain and falls.  Skin: Negative for itching and rash.  Neurological: Negative for dizziness and weakness. Negative for tingling, tremors, sensory change, speech change, focal weakness, loss of consciousness and headaches.  Endo/Heme/Allergies: Negative for environmental allergies and polydipsia. Does not bruise/bleed easily.  Psychiatric/Behavioral: Negative for suicidal ideas. The patient is not nervous/anxious.      Past Medical History  Diagnosis Date  . Hypertension   . Arthritis   . BPH (benign prostatic hypertrophy)   . Carpal tunnel syndrome   . Hyperlipidemia   . Stroke   . Diverticulosis   . Dementia   . Complication of anesthesia     unknown   Past Surgical History  Procedure Laterality Date  . Appendectomy    . Carpal tunnel release    . Hip replacement-left    . Joint replacement    . Rt knee replacement     Social History:  reports that he has never smoked. He has never used smokeless tobacco. He reports that he does not drink alcohol or use illicit drugs.  No Known Allergies  Family History: no history of cancers, no cardiovascular diseases on mother or father side  Prior to  Admission medications   Medication Sig Start Date End Date Taking? Authorizing Provider  citalopram (CELEXA) 10 MG tablet Take 1 tablet (10 mg total) by mouth daily. 05/26/12  Yes Timothy Glaze, MD  colchicine 0.6 MG tablet Take  1 tablet (0.6 mg total) by mouth 2 (two) times daily. 05/26/12  Yes Timothy Glaze, MD  donepezil (ARICEPT) 10 MG tablet Take 10 mg by mouth at bedtime.  05/26/12  Yes Timothy Glaze, MD  HYDROcodone-homatropine [facility]) 5-1.5 MG/5ML syrup Take 5 mLs by mouth every 8 (eight) hours as needed for cough. 05/27/12  Yes Timothy Glaze, MD  memantine (NAMENDA) 10 MG tablet Take 10 mg by mouth 2 (two) times daily.   Yes Historical Provider, MD  moxifloxacin (AVELOX) 400 MG tablet Take 400 mg by mouth daily. 10 day course started 05/27/12. 05/27/12 06/06/12 Yes Historical Provider, MD   Physical Exam: Filed Vitals:   05/28/12 1312 05/28/12 1740 05/28/12 1740 05/28/12 1743  BP: 103/70   132/85  Pulse: 83   94  Temp: 98.2 F (36.8 C)   98.3 F (36.8 C)  TempSrc: Oral   Oral  Resp: 16   23  Height:  6\' 1"  (1.854 m) 6' (1.829 m)   Weight:  87.499 kg (192 lb 14.4 oz) 87.499 kg (192 lb 14.4 oz)   SpO2: 96%   97%    Physical Exam  Constitutional: Appears well-developed and well-nourished. No distress.  HENT: Normocephalic. External right and left ear normal. Oropharynx is clear and moist.  Eyes: Conjunctivae and EOM are normal. PERRLA, no scleral icterus.  Neck: Normal ROM. Neck supple. No JVD. No tracheal deviation. No thyromegaly.  CVS: RRR, S1/S2 +, no murmurs, no gallops, no carotid bruit.  Pulmonary: Effort and breath sounds normal, no stridor, rhonchi, wheezes, rales.  Abdominal: Soft. BS +,  no distension, tenderness, rebound or guarding.  Musculoskeletal: Normal range of motion. No edema and no tenderness.  Lymphadenopathy: No lymphadenopathy noted, cervical, inguinal. Neuro: Alert. Normal reflexes, muscle tone coordination. No cranial nerve deficit. Skin: Skin is warm and dry. No rash noted. Not diaphoretic. No erythema. No pallor.  Psychiatric: Normal mood and affect. Behavior, judgment, thought content normal.   Labs on Admission:  Basic Metabolic Panel:  Recent Labs Lab 05/28/12 1525   NA 136  K 4.4  CL 102  CO2 22  GLUCOSE 128*  BUN 31*  CREATININE 1.30  CALCIUM 8.8   Liver Function Tests:  Recent Labs Lab 05/28/12 1525  AST 14  ALT 14  ALKPHOS 102  BILITOT 0.7  PROT 6.4  ALBUMIN 3.0*    Recent Labs Lab 05/28/12 1525  LIPASE 73*   No results found for this basename: AMMONIA,  in the last 168 hours CBC:  Recent Labs Lab 05/28/12 1525  WBC 9.2  NEUTROABS 6.2  HGB 13.4  HCT 41.8  MCV 91.1  PLT 104*   Radiological Exams on Admission: Dg Chest 2 View 05/28/2012  * IMPRESSION: .  Small left pleural effusion with left basilar atelectasis or infiltrate.  No pulmonary edema.   Original Report Authenticated By: Natasha Mead, M.D.    Ct Abdomen Pelvis W Contrast 05/28/2012  IMPRESSION: Large pulmonary embolus at the bifurcation of the right pulmonary artery.  4.4 x 4.0 x 8.4 cm distal abdominal aortic aneurysm without evidence of rupture/leak. Sigmoid diverticulosis without evidence of diverticulitis. Small left pleural effusion with bibasilar atelectasis and probable small focus of infiltrate left lower lobe.  Critical Value/emergent results  were called by telephone at the time of interpretation on 05/28/2012 at 1657 hours to Dr. Ethelda Chick, who verbally acknowledged these results.   Original Report Authenticated By: Ulyses Southward, M.D.     Code Status: Full Family Communication: Pt at bedside Disposition Plan: Admit for further evaluation  Manson Passey, MD  [facility] Pager [phone]  If 7PM-7AM, please contact night-coverage [email or web address] Password [facility] 05/28/2012, 6:33 PM

## 2012-05-28 NOTE — Telephone Encounter (Signed)
Caller states facility for pt yesterday regarding chest pain from coughing.  Pt was prescribed Avelox, Hycodan, and had UA and CXR ordered.  Caller states pt is having more pain and coughing worse (mainly at night).  Caller wants to  Know if pt can be prescribed something for pain or if pt needs to be put in the hospital to manage symptoms.  Triaged patient per Cough Protocol.  ED Dispo was reached for 'Chest pain present when not coughing'.  OFFICE PLEASE FOLLOW UP WITH CALLER AFTER SPEAKING WITH DR Lovell Sheehan

## 2012-05-28 NOTE — Progress Notes (Addendum)
ANTICOAGULATION CONSULT NOTE - Initial Consult  Pharmacy Consult for Lovenox Indication: pulmonary embolus  No Known Allergies  Patient Measurements: Height: 6' (182.9 cm) Weight: 192 lb 14.4 oz (87.499 kg) IBW/kg (Calculated) : 77.6   Vital Signs: Temp: 98.2 F (36.8 C) (05/02 1312) Temp src: Oral (05/02 1312) BP: 103/70 mmHg (05/02 1312) Pulse Rate: 83 (05/02 1312)  Labs:  Recent Labs  05/28/12 1525  HGB 13.4  HCT 41.8  PLT 104*  CREATININE 1.30    Estimated Creatinine Clearance: 45.6 ml/min (by C-G formula based on Cr of 1.3).   Medical History: Past Medical History  Diagnosis Date  . Hypertension   . Arthritis   . BPH (benign prostatic hypertrophy)   . Carpal tunnel syndrome   . Hyperlipidemia   . Stroke   . Diverticulosis   . Dementia     Medications:   (Not in a hospital admission) Scheduled:   Infusions:   PRN: [COMPLETED] iohexol, [COMPLETED] iohexol  Assessment: 87 YOM from SNF brought to [facility] ER w/ cc LUQ abdominal pain altered LOC. CT shows large PE and abdominal aortic aneurysm without evidence of rupture/leak. Pharmacy asked to dose Lovenox  Scr 1.3  Pltc low 103, H/H wnl  No active bleeding reported  Goal of Therapy:  INR 2-3 Monitor platelets by anticoagulation protocol: Yes   Plan:  Lovenox 90mg  sq q12h Monitor Scr, CBC, s/sx bleeding Adjust dose as necessary  Gwen Her PharmD  [phone] 05/28/2012 5:37 PM    Coumadin per pharmacy requested No baseline Coag labs Pt on Levaquin - may increase INR Advanced age so will likely require less Coumadin to become tx and may take longer than normal to achieve goal INR 2-3  Plan INR and PTT now Coumadin 2.5mg  tongiht Daily INR  Gwen Her PharmD  [phone] 05/28/2012 7:42 PM

## 2012-05-29 DIAGNOSIS — K859 Acute pancreatitis without necrosis or infection, unspecified: Secondary | ICD-10-CM

## 2012-05-29 DIAGNOSIS — R109 Unspecified abdominal pain: Secondary | ICD-10-CM

## 2012-05-29 DIAGNOSIS — I2699 Other pulmonary embolism without acute cor pulmonale: Secondary | ICD-10-CM

## 2012-05-29 DIAGNOSIS — J189 Pneumonia, unspecified organism: Secondary | ICD-10-CM

## 2012-05-29 LAB — COMPREHENSIVE METABOLIC PANEL
BUN: 26 mg/dL — ABNORMAL HIGH (ref 6–23)
CO2: 26 mEq/L (ref 19–32)
Calcium: 8.7 mg/dL (ref 8.4–10.5)
Chloride: 101 mEq/L (ref 96–112)
Creatinine, Ser: 1.23 mg/dL (ref 0.50–1.35)
GFR calc Af Amer: 60 mL/min — ABNORMAL LOW (ref >90)
GFR calc non Af Amer: 52 mL/min — ABNORMAL LOW (ref >90)
Total Bilirubin: 0.7 mg/dL (ref 0.3–1.2)

## 2012-05-29 LAB — CBC
Hemoglobin: 13.4 g/dL (ref 13.0–17.0)
Platelets: 110 10*3/uL — ABNORMAL LOW (ref 150–400)
RBC: 4.55 MIL/uL (ref 4.22–5.81)
WBC: 6.8 10*3/uL (ref 4.0–10.5)

## 2012-05-29 LAB — GLUCOSE, CAPILLARY: Glucose-Capillary: 84 mg/dL (ref 70–99)

## 2012-05-29 LAB — PROTIME-INR
INR: 1.2 (ref 0.00–1.49)
Prothrombin Time: 15 seconds (ref 11.6–15.2)

## 2012-05-29 MED ORDER — VITAMINS A & D EX OINT
TOPICAL_OINTMENT | CUTANEOUS | Status: AC
  Administered 2012-05-29: 11:00:00
  Filled 2012-05-29: qty 5

## 2012-05-29 MED ORDER — WARFARIN SODIUM 2.5 MG PO TABS
2.5000 mg | ORAL_TABLET | Freq: Once | ORAL | Status: AC
  Administered 2012-05-29: 2.5 mg via ORAL
  Filled 2012-05-29: qty 1

## 2012-05-29 MED ORDER — LORAZEPAM 2 MG/ML IJ SOLN
0.5000 mg | Freq: Once | INTRAMUSCULAR | Status: AC
  Administered 2012-05-29: 0.5 mg via INTRAVENOUS
  Filled 2012-05-29: qty 1

## 2012-05-29 MED ORDER — GUAIFENESIN-DM 100-10 MG/5ML PO SYRP: 5.0000 mL | ORAL_SOLUTION | ORAL | Status: DC | PRN

## 2012-05-29 MED ORDER — LEVOFLOXACIN IN D5W 750 MG/150ML IV SOLN
750.0000 mg | INTRAVENOUS | Status: DC
  Administered 2012-05-30: 750 mg via INTRAVENOUS
  Filled 2012-05-29 (×2): qty 150

## 2012-05-29 NOTE — Evaluation (Signed)
Physical Therapy Evaluation Patient Details Name: Timothy [name] MRN: [id number] DOB: 06-15-1924 Today's Date: 05/29/2012 Time: 1150-1207 PT Time Calculation (min): 17 min  PT Assessment / Plan / Recommendation Clinical Impression  79 yo male admitted with abd pain, PE, AAA. Pt is from ALF-wife states he was able to ambulate to dining room with RW, although his mobility is limited. On eval, pt required +2 assist for mobility-able to ambulate ~40 feet. Recommend SNF unless facility is able to provide current level of care.     PT Assessment  Patient needs continued PT services    Follow Up Recommendations  SNF    Does the patient have the potential to tolerate intense rehabilitation      Barriers to Discharge        Equipment Recommendations  None recommended by PT    Recommendations for Other Services OT consult   Frequency Min 3X/week    Precautions / Restrictions Precautions Precautions: Fall Restrictions Weight Bearing Restrictions: No   Pertinent Vitals/Pain Back-unrated      Mobility  Bed Mobility Bed Mobility: Supine to Sit Supine to Sit: 1: +2 Total assist;HOB elevated;With rails Supine to Sit: Patient Percentage: 30% Details for Bed Mobility Assistance: Multimodal cues for safety, technique, hand placement. assist for trunk to upright and bil LEs off bed. Utilized bed pad to aid with scooting, positioning.  Transfers Transfers: Sit to Stand;Stand to Sit Sit to Stand: 1: +2 Total assist;From bed;From elevated surface Sit to Stand: Patient Percentage: 40% Stand to Sit: 1: +2 Total assist;To elevated surface;To bed Stand to Sit: Patient Percentage: 40% Details for Transfer Assistance: Multimodal cues for safety, technique, hand placement. assist to shift/maintain weight anteriorly.  Ambulation/Gait Ambulation/Gait Assistance: 1: +2 Total assist Ambulation/Gait: Patient Percentage: 60% Ambulation Distance (Feet): 40 Feet Assistive device: Rolling  walker Ambulation/Gait Assistance Details: Multimodal cues for safety, technique, distance from RW. Assist to stabilize/support pt in standing, maneuver with RW. Slow gait speed.  Gait Pattern: Decreased step length - right;Decreased step length - left;Decreased stride length    Exercises     PT Diagnosis: Difficulty walking;Abnormality of gait;Generalized weakness;Altered mental status  PT Problem List: Decreased strength;Decreased activity tolerance;Decreased balance;Decreased mobility;Decreased cognition;Decreased knowledge of use of DME;Pain PT Treatment Interventions: DME instruction;Gait training;Functional mobility training;Therapeutic activities;Therapeutic exercise;Balance training;Patient/family education   PT Goals Acute Rehab PT Goals PT Goal Formulation: Patient unable to participate in goal setting Time For Goal Achievement: 06/12/12 Potential to Achieve Goals: Good Pt will go Supine/Side to Sit: with min assist PT Goal: Supine/Side to Sit - Progress: Goal set today Pt will go Sit to Supine/Side: with min assist PT Goal: Sit to Supine/Side - Progress: Goal set today Pt will go Sit to Stand: with min assist PT Goal: Sit to Stand - Progress: Goal set today Pt will Ambulate: 51 - 150 feet;with rolling walker;with min assist PT Goal: Ambulate - Progress: Goal set today  Visit Information  Last PT Received On: 05/29/12 Assistance Needed: +2    Subjective Data  Subjective: Work?  Patient Stated Goal: none stated   Prior Functioning  Home Living Type of Home: Assisted living Home Adaptive Equipment: Walker - rolling Prior Function Level of Independence: Needs assistance Communication Communication: No difficulties    Cognition  Cognition Arousal/Alertness: Lethargic Behavior During Therapy: WFL for tasks assessed/performed Overall Cognitive Status: History of cognitive impairments - at baseline    Extremity/Trunk Assessment Right Lower Extremity Assessment RLE  ROM/Strength/Tone: Deficits RLE ROM/Strength/Tone Deficits: Very rigid throughout LEs. Strength at  least 3/5 with functional mobility Left Lower Extremity Assessment LLE ROM/Strength/Tone: Deficits LLE ROM/Strength/Tone Deficits: Very rigid throughout LEs. Strength at least 3/5 with functional mobility Trunk Assessment Trunk Assessment:  (rigid)   Balance    End of Session PT - End of Session Equipment Utilized During Treatment: Gait belt Activity Tolerance: Patient limited by fatigue Patient left: in bed;with call bell/phone within reach;with bed alarm set  GP     Rebeca Alert, MPT Pager: [phone]

## 2012-05-29 NOTE — Progress Notes (Signed)
ANTICOAGULATION CONSULT NOTE - Follow Up  Pharmacy Consult for Lovenox and Warfarin Indication: pulmonary embolus  No Known Allergies  Patient Measurements: Height: 6' (182.9 cm) Weight: 192 lb (87.091 kg) IBW/kg (Calculated) : 77.6   Vital Signs: Temp: 98.5 F (36.9 C) (05/03 0600) Temp src: Oral (05/03 0600) BP: 125/84 mmHg (05/03 0600) Pulse Rate: 78 (05/03 0600)  Labs:  Recent Labs  05/28/12 1525 05/28/12 2047 05/28/12 2048 05/29/12 0555  HGB 13.4  --  14.2 13.4  HCT 41.8  --  44.4 41.3  PLT 104*  --  117* 110*  APTT  --  41*  --   --   LABPROT  --  15.1  --  15.0  INR  --  1.21  --  1.20  CREATININE 1.30  --  1.32 1.23    Estimated Creatinine Clearance: 48.2 ml/min (by C-G formula based on Cr of 1.23).  Anticoag or Interacting Medications:  Lovenox 90mg  SQ q12h Levaquin 750mg  IV q24h (may increase INR response)  Assessment: 79 YO M from SNF brought to [facility] ER w/ cc LUQ abdominal pain. CT shows large PE and abdominal aortic aneurysm without evidence of rupture/leak. Pharmacy asked to dose Lovenox and warfarin.  Today is 1st full day of warfarin/Lovenox overlap  INR subtherapeutic as expected after only 1 dose of warfarin  Pltc low (were low on admission)  H/H wnl  CrCl>76ml/min  No active bleeding reported  Goal of Therapy:  INR 2-3 Monitor platelets by anticoagulation protocol: Yes   Plan:  1) Repeat warfarin 2.5mg  PO x1 at 18:00 2) Continue Lovenox 90mg  sq q12h for a total of 5 days AND until INR>2 x2 consecutive days. 3) Monitor Scr, CBC, s/sx bleeding  Darrol Angel, PharmD Pager: [phone] 05/29/2012 7:18 AM

## 2012-05-29 NOTE — Progress Notes (Signed)
[facility] PROGRESS NOTE  Virgal Warmuth Penrod ONG:[id number] DOB: 07-23-1924 DOA: 05/28/2012 PCP: Carrie Mew, MD  Assessment/Plan:  Abdominal pain, other specified site  -improved.  - likely due to diverticulosis and pancreatitis  - lipase level was elevated at 73 on this admission.repeat lipase levels are normal.  - continue IV fluids, antiemetics and analgesia with morphine 1 mg Q 4 hours PRN IV  - may advance diet as toelrated  Active Problems:  Acute Pancreatitis  -appears to have resolved.  DEMENTIA, ADVANCED  - continue namenda and aricept  Pulmonary embolism  - lovenox and coumadin per pharmacy  - venous duplex ordered - echo pending.  CAP (community acquired pneumonia)  - started levaquin 750 mg IV daily  Dementia: continue with aricept and namenda.    DVT prophylaxis: on therapeutic lovenox.    HPI/Subjective: Comfortable. No new complaints.   Objective: Filed Vitals:   05/28/12 1743 05/28/12 2007 05/28/12 2200 05/29/12 0600  BP: 132/85 129/72 138/84 125/84  Pulse: 94 64 80 78  Temp: 98.3 F (36.8 C) 98.6 F (37 C) 98.7 F (37.1 C) 98.5 F (36.9 C)  TempSrc: Oral Oral Oral Oral  Resp: 23 20 20 20   Height:  6' (1.829 m)    Weight:  87.091 kg (192 lb)    SpO2: 97% 93% 96% 96%    Intake/Output Summary (Last 24 hours) at 05/29/12 1058 Last data filed at 05/29/12 0952  Gross per 24 hour  Intake    120 ml  Output   1050 ml  Net   -930 ml   Filed Weights   05/28/12 1740 05/28/12 1740 05/28/12 2007  Weight: 87.499 kg (192 lb 14.4 oz) 87.499 kg (192 lb 14.4 oz) 87.091 kg (192 lb)    Exam: Alert afebrile comfortable.  CVS: RRR, S1/S2 +, no murmurs, no gallops, no carotid bruit.  Pulmonary: Effort and breath sounds normal, no stridor, rhonchi, wheezes, rales.  Abdominal: Soft. BS +, no distension, tenderness, rebound or guarding.  Musculoskeletal: Normal range of motion. No edema and no tenderness.  Neuro: Alert. Normal reflexes, muscle  tone coordination. No cranial nerve deficit.   Data Reviewed: Basic Metabolic Panel:  Recent Labs Lab 05/28/12 1525 05/28/12 2048 05/29/12 0555  NA 136 135 136  K 4.4 4.2 4.1  CL 102 99 101  CO2 22 24 26   GLUCOSE 128* 109* 93  BUN 31* 28* 26*  CREATININE 1.30 1.32 1.23  CALCIUM 8.8 9.1 8.7  MG  --  2.0  --   PHOS  --  3.2  --    Liver Function Tests:  Recent Labs Lab 05/28/12 1525 05/28/12 2048 05/29/12 0555  AST 14 14 11   ALT 14 14 13   ALKPHOS 102 109 98  BILITOT 0.7 0.8 0.7  PROT 6.4 6.6 6.0  ALBUMIN 3.0* 3.1* 2.8*    Recent Labs Lab 05/28/12 1525 05/29/12 0555  LIPASE 73* 31   No results found for this basename: AMMONIA,  in the last 168 hours CBC:  Recent Labs Lab 05/28/12 1525 05/28/12 2048 05/29/12 0555  WBC 9.2 9.6 6.8  NEUTROABS 6.2 5.9  --   HGB 13.4 14.2 13.4  HCT 41.8 44.4 41.3  MCV 91.1 91.2 90.8  PLT 104* 117* 110*   Cardiac Enzymes: No results found for this basename: CKTOTAL, CKMB, CKMBINDEX, TROPONINI,  in the last 168 hours BNP (last 3 results) No results found for this basename: PROBNP,  in the last 8760 hours CBG:  Recent Labs Lab 05/29/12  0727  GLUCAP 84    No results found for this or any previous visit (from the past 240 hour(s)).   Studies: Dg Chest 2 View  05/28/2012  *RADIOLOGY REPORT*  Clinical Data: Abdominal pain  CHEST - 2 VIEW  Comparison: 07/22/2011  Findings: Cardiomediastinal silhouette is stable.  There is small left pleural effusion with left basilar atelectasis or infiltrate. No pulmonary edema.  IMPRESSION: .  Small left pleural effusion with left basilar atelectasis or infiltrate.  No pulmonary edema.   Original Report Authenticated By: Natasha Mead, M.D.    Ct Abdomen Pelvis W Contrast  05/28/2012  *RADIOLOGY REPORT*  Clinical Data: Check, left upper quadrant pain, change in level of consciousness, abdominal distention, decreased bowel sounds, past history hypertension  CT ABDOMEN AND PELVIS WITH CONTRAST   Technique:  Multidetector CT imaging of the abdomen and pelvis was performed following the standard protocol during bolus administration of intravenous contrast. Sagittal and coronal MPR images reconstructed from axial data set.  Contrast: OMNIPAQUE IOHEXOL 300 MG/ML  SOLN Dilute oral contrast.  Comparison: None  Findings: Small left pleural effusion with bibasilar atelectasis and suspect small focus of left basilar infiltrate. Large filling defect identified at bifurcation of right pulmonary artery consistent with a large pulmonary embolus. Questionable small filling defect within a left lower lobe pulmonary artery versus artifact, incompletely assessed, images 1- 2.  Liver, spleen, atrophic pancreas, and kidneys unremarkable. Mildly thickened bilateral adrenal glands without discrete nodule. Extensive atherosclerotic calcifications with aneurysmal dilatation of the distal abdominal aorta up to 4.4 x 4.0 cm image 48, approximately 8.4 cm length. Aneurysm extends to but not through aortic bifurcation. No evidence of aneurysm leak, perianeurysmal infiltration or fibrosis.  Bilateral inguinal hernias containing fat. Well-distended unremarkable bladder. Portions of pelvis obscured by beam hardening artifacts from a left hip prosthesis. Prostatic enlargement gland with 6.0 x 5.0 by 5.6 cm. Sigmoid diverticulosis without evidence of diverticulitis. Upper normal-sized left common iliac lymph node 10 mm short axis image 52.  No additional mass, adenopathy, free fluid or inflammatory process. Short segment of the mid transverse colon shows questionable wall thickening versus artifact from underdistension. Stomach remaining bowel loops normal appearance. Severe multilevel degenerative disc and facet disease changes lumbar spine.  IMPRESSION: Large pulmonary embolus at the bifurcation of the right pulmonary artery.  4.4 x 4.0 x 8.4 cm distal abdominal aortic aneurysm without evidence of rupture/leak. Sigmoid  diverticulosis without evidence of diverticulitis. Small left pleural effusion with bibasilar atelectasis and probable small focus of infiltrate left lower lobe.  Critical Value/emergent results were called by telephone at the time of interpretation on 05/28/2012 at 1657 hours to Dr. Ethelda Chick, who verbally acknowledged these results.   Original Report Authenticated By: Ulyses Southward, M.D.     Scheduled Meds: . citalopram  10 mg Oral Daily  . colchicine  0.6 mg Oral BID  . donepezil  10 mg Oral QHS  . enoxaparin (LOVENOX) injection  90 mg Subcutaneous Q12H  . [START ON 05/30/2012] levofloxacin (LEVAQUIN) IV  750 mg Intravenous Q48H  . memantine  10 mg Oral BID  . sodium chloride  3 mL Intravenous Q12H  . vitamin A & D      . warfarin  2.5 mg Oral ONCE-1800  . Warfarin - Pharmacist Dosing Inpatient   Does not apply q1800   Continuous Infusions: . sodium chloride 75 mL/hr at 05/28/12 2023    Principal Problem:   Abdominal  pain, other specified site Active Problems:   HYPERLIPIDEMIA  HYPERTENSION   DEMENTIA, ADVANCED   Pulmonary embolism   Pancreatitis   CAP (community acquired pneumonia)    Time spent: 30 minutes     Yuji Walth  [facility] Pager [phone]. If 7PM-7AM, please contact night-coverage at [email or web address], password [facility] 05/29/2012, 10:58 AM  LOS: 1 day

## 2012-05-29 NOTE — Progress Notes (Signed)
VASCULAR LAB PRELIMINARY  PRELIMINARY  PRELIMINARY  PRELIMINARY  Bilateral lower extremity venous Dopplers completed.    Preliminary report:  There is no DVT or SVT noted in the bilateral lower extremities.  Quentin Strebel, RVT 05/29/2012, 3:00 PM

## 2012-05-29 NOTE — Progress Notes (Signed)
Patient has had several runs of V-tach, but each time patient is fine, last run was 4 beats vital signs normal, patient confused  Blood pressure 114/77 Pulse 79 Temperature 98.6 Oxygen saturation 94% on room air Respirations 24

## 2012-05-30 DIAGNOSIS — I517 Cardiomegaly: Secondary | ICD-10-CM

## 2012-05-30 LAB — GLUCOSE, CAPILLARY

## 2012-05-30 MED ORDER — WARFARIN SODIUM 2.5 MG PO TABS
2.5000 mg | ORAL_TABLET | Freq: Once | ORAL | Status: AC
  Administered 2012-05-30: 2.5 mg via ORAL
  Filled 2012-05-30: qty 1

## 2012-05-30 MED ORDER — CARVEDILOL 3.125 MG PO TABS
3.1250 mg | ORAL_TABLET | Freq: Two times a day (BID) | ORAL | Status: DC
  Administered 2012-05-30 – 2012-06-01 (×4): 3.125 mg via ORAL
  Filled 2012-05-30 (×7): qty 1

## 2012-05-30 MED ORDER — LISINOPRIL 2.5 MG PO TABS
2.5000 mg | ORAL_TABLET | Freq: Every day | ORAL | Status: DC
  Administered 2012-05-31 – 2012-06-01 (×2): 2.5 mg via ORAL
  Filled 2012-05-30 (×2): qty 1

## 2012-05-30 NOTE — Progress Notes (Signed)
[facility] PROGRESS NOTE  Adrion Menz Dillin ZOX:[id number] DOB: April 28, 1924 DOA: 05/28/2012 PCP: Carrie Mew, MD  Assessment/Plan:  Abdominal pain, other specified site  -RESOLVED.  - continue IV fluids, antiemetics and analgesia with morphine 1 mg Q 4 hours PRN IV  - may advance diet as toelrated  Active Problems:  Acute Pancreatitis  -appears to have resolved.  DEMENTIA, ADVANCED  - continue namenda and aricept  Pulmonary embolism  - lovenox and coumadin per pharmacy  - venous duplex ordered and negative for DVT - echo showed LVEF 40%, with inferior and posterior hypokinesis. No right heart strain evident on the echocardiogram.  -sub therapeutic INR.  CAP (community acquired pneumonia)  - started levaquin 750 mg IV daily  Dementia: continue with aricept and namenda.    DVT prophylaxis: on therapeutic lovenox.   FULL CODE Discussed with son and HCPOA, who wants to continue with coumadin .   HPI/Subjective: Comfortable. No new complaints.   Objective: Filed Vitals:   05/29/12 1351 05/29/12 2200 05/30/12 0600 05/30/12 1300  BP: 132/78 119/77 132/90 116/82  Pulse: 72 74 83 78  Temp: 98.6 F (37 C) 97.6 F (36.4 C) 97.4 F (36.3 C) 98.1 F (36.7 C)  TempSrc: Oral Oral Oral Oral  Resp: 20 20 18 18   Height:      Weight:      SpO2: 97% 99% 94% 95%    Intake/Output Summary (Last 24 hours) at 05/30/12 1615 Last data filed at 05/30/12 1342  Gross per 24 hour  Intake 3236.25 ml  Output   1400 ml  Net 1836.25 ml   Filed Weights   05/28/12 1740 05/28/12 1740 05/28/12 2007  Weight: 87.499 kg (192 lb 14.4 oz) 87.499 kg (192 lb 14.4 oz) 87.091 kg (192 lb)    Exam: Alert afebrile comfortable.  CVS: RRR, S1/S2 +, no murmurs, no gallops, no carotid bruit.  Pulmonary: Effort and breath sounds normal, no stridor, rhonchi, wheezes, rales.  Abdominal: Soft. BS +, no distension, tenderness, rebound or guarding.  Musculoskeletal: Normal range of motion. No  edema and no tenderness.  Neuro: Alert. Normal reflexes, muscle tone coordination. No cranial nerve deficit.   Data Reviewed: Basic Metabolic Panel:  Recent Labs Lab 05/28/12 1525 05/28/12 2048 05/29/12 0555  NA 136 135 136  K 4.4 4.2 4.1  CL 102 99 101  CO2 22 24 26   GLUCOSE 128* 109* 93  BUN 31* 28* 26*  CREATININE 1.30 1.32 1.23  CALCIUM 8.8 9.1 8.7  MG  --  2.0  --   PHOS  --  3.2  --    Liver Function Tests:  Recent Labs Lab 05/28/12 1525 05/28/12 2048 05/29/12 0555  AST 14 14 11   ALT 14 14 13   ALKPHOS 102 109 98  BILITOT 0.7 0.8 0.7  PROT 6.4 6.6 6.0  ALBUMIN 3.0* 3.1* 2.8*    Recent Labs Lab 05/28/12 1525 05/29/12 0555  LIPASE 73* 31   No results found for this basename: AMMONIA,  in the last 168 hours CBC:  Recent Labs Lab 05/28/12 1525 05/28/12 2048 05/29/12 0555  WBC 9.2 9.6 6.8  NEUTROABS 6.2 5.9  --   HGB 13.4 14.2 13.4  HCT 41.8 44.4 41.3  MCV 91.1 91.2 90.8  PLT 104* 117* 110*   Cardiac Enzymes: No results found for this basename: CKTOTAL, CKMB, CKMBINDEX, TROPONINI,  in the last 168 hours BNP (last 3 results) No results found for this basename: PROBNP,  in the last 8760 hours CBG:  Recent Labs Lab 05/29/12 0727 05/30/12 0723  GLUCAP 84 79    No results found for this or any previous visit (from the past 240 hour(s)).   Studies: Ct Abdomen Pelvis W Contrast  05/28/2012  *RADIOLOGY REPORT*  Clinical Data: Check, left upper quadrant pain, change in level of consciousness, abdominal distention, decreased bowel sounds, past history hypertension  CT ABDOMEN AND PELVIS WITH CONTRAST  Technique:  Multidetector CT imaging of the abdomen and pelvis was performed following the standard protocol during bolus administration of intravenous contrast. Sagittal and coronal MPR images reconstructed from axial data set.  Contrast: OMNIPAQUE IOHEXOL 300 MG/ML  SOLN Dilute oral contrast.  Comparison: None  Findings: Small left pleural  effusion with bibasilar atelectasis and suspect small focus of left basilar infiltrate. Large filling defect identified at bifurcation of right pulmonary artery consistent with a large pulmonary embolus. Questionable small filling defect within a left lower lobe pulmonary artery versus artifact, incompletely assessed, images 1- 2.  Liver, spleen, atrophic pancreas, and kidneys unremarkable. Mildly thickened bilateral adrenal glands without discrete nodule. Extensive atherosclerotic calcifications with aneurysmal dilatation of the distal abdominal aorta up to 4.4 x 4.0 cm image 48, approximately 8.4 cm length. Aneurysm extends to but not through aortic bifurcation. No evidence of aneurysm leak, perianeurysmal infiltration or fibrosis.  Bilateral inguinal hernias containing fat. Well-distended unremarkable bladder. Portions of pelvis obscured by beam hardening artifacts from a left hip prosthesis. Prostatic enlargement gland with 6.0 x 5.0 by 5.6 cm. Sigmoid diverticulosis without evidence of diverticulitis. Upper normal-sized left common iliac lymph node 10 mm short axis image 52.  No additional mass, adenopathy, free fluid or inflammatory process. Short segment of the mid transverse colon shows questionable wall thickening versus artifact from underdistension. Stomach remaining bowel loops normal appearance. Severe multilevel degenerative disc and facet disease changes lumbar spine.  IMPRESSION: Large pulmonary embolus at the bifurcation of the right pulmonary artery.  4.4 x 4.0 x 8.4 cm distal abdominal aortic aneurysm without evidence of rupture/leak. Sigmoid diverticulosis without evidence of diverticulitis. Small left pleural effusion with bibasilar atelectasis and probable small focus of infiltrate left lower lobe.  Critical Value/emergent results were called by telephone at the time of interpretation on 05/28/2012 at 1657 hours to Dr. Ethelda Chick, who verbally acknowledged these results.   Original Report  Authenticated By: Ulyses Southward, M.D.     Scheduled Meds: . citalopram  10 mg Oral Daily  . colchicine  0.6 mg Oral BID  . donepezil  10 mg Oral QHS  . enoxaparin (LOVENOX) injection  90 mg Subcutaneous Q12H  . levofloxacin (LEVAQUIN) IV  750 mg Intravenous Q48H  . memantine  10 mg Oral BID  . sodium chloride  3 mL Intravenous Q12H  . warfarin  2.5 mg Oral ONCE-1800  . Warfarin - Pharmacist Dosing Inpatient   Does not apply q1800   Continuous Infusions: . sodium chloride 75 mL/hr at 05/30/12 0343    Principal Problem:   Abdominal  pain, other specified site Active Problems:   HYPERLIPIDEMIA   HYPERTENSION   DEMENTIA, ADVANCED   Pulmonary embolism   Pancreatitis   CAP (community acquired pneumonia)    Time spent: 30 minutes     Timothy [name]  [facility] Pager [phone]. If 7PM-7AM, please contact night-coverage at [email or web address], password [facility] 05/30/2012, 4:15 PM  LOS: 2 days

## 2012-05-30 NOTE — Progress Notes (Signed)
ANTICOAGULATION CONSULT NOTE - Follow Up  Pharmacy Consult for Lovenox and Warfarin Indication: pulmonary embolus  No Known Allergies  Patient Measurements: Height: 6' (182.9 cm) Weight: 192 lb (87.091 kg) IBW/kg (Calculated) : 77.6   Vital Signs: Temp: 97.4 F (36.3 C) (05/04 0600) Temp src: Oral (05/04 0600) BP: 132/90 mmHg (05/04 0600) Pulse Rate: 83 (05/04 0600)  Labs:  Recent Labs  05/28/12 1525 05/28/12 2047 05/28/12 2048 05/29/12 0555 05/30/12 0530  HGB 13.4  --  14.2 13.4  --   HCT 41.8  --  44.4 41.3  --   PLT 104*  --  117* 110*  --   APTT  --  41*  --   --   --   LABPROT  --  15.1  --  15.0 15.1  INR  --  1.21  --  1.20 1.21  CREATININE 1.30  --  1.32 1.23  --     Estimated Creatinine Clearance: 48.2 ml/min (by C-G formula based on Cr of 1.23).  Anticoag or Interacting Medications:  Lovenox 90mg  SQ q12h Levaquin 750mg  IV q48h (may increase INR response)  Assessment: 79 YO M from SNF brought to [facility] ER w/ cc LUQ abdominal pain. CT shows large PE and abdominal aortic aneurysm without evidence of rupture/leak. Pharmacy asked to dose Lovenox and warfarin.  Today is 2nd full day of warfarin/Lovenox overlap  INR subtherapeutic as expected after only 2 doses of warfarin  Pltc low (were low on admission)  H/H wnl on 5/3  CrCl>88ml/min  No active bleeding reported  Goal of Therapy:  INR 2-3 Monitor platelets by anticoagulation protocol: Yes   Plan:  1) Repeat warfarin 2.5mg  PO x1 at 18:00 2) Continue Lovenox 90mg  sq q12h for a total of 5 days AND until INR>2 x2 consecutive days. 3) Monitor Scr, CBC, s/sx bleeding  Darrol Angel, PharmD Pager: [phone] 05/30/2012 10:07 AM

## 2012-05-30 NOTE — Progress Notes (Signed)
Call received from cardiac monitoring department to say that pt has been having some bigeminys and trigeminys throughout shift. Upon assessment, pt noted to be resting quietly in bed with eyes opened. Vitals 97.9, 66, 16, 111/75, 95% ra. MD made aware. New order to be placed per MD. Will cont to monitor pt. Timothy [name]

## 2012-05-30 NOTE — Progress Notes (Addendum)
Clinical Social Work Department CLINICAL SOCIAL WORK PLACEMENT NOTE 05/30/2012  Patient:  Timothy [name], Timothy [name]  Account Number:  [id number] Admit date:  05/28/2012  Clinical Social Worker:  Doroteo Glassman  Date/time:  05/30/2012 11:37 AM  Clinical Social Work is seeking post-discharge placement for this patient at the following level of care:   SKILLED NURSING   (*CSW will update this form in Epic as items are completed)   05/30/2012  Patient/family provided with [facility] Department of Clinical Social Work's list of facilities offering this level of care within the geographic area requested by the patient (or if unable, by the patient's family).  05/30/2012  Patient/family informed of their freedom to choose among providers that offer the needed level of care, that participate in Medicare, Medicaid or managed care program needed by the patient, have an available bed and are willing to accept the patient.  05/30/2012  Patient/family informed of [facility]' ownership interest in [facility], as well as of the fact that they are under no obligation to receive care at this facility.  PASARR submitted to EDS on 05/30/2012 PASARR number received from EDS on 05/30/2012  FL2 transmitted to all facilities in geographic area requested by pt/family on  05/30/2012 FL2 transmitted to all facilities within larger geographic area on   Patient informed that his/her managed care company has contracts with or will negotiate with  certain facilities, including the following:     Patient/family informed of bed offers received:  05/31/12 Patient chooses bed at [facility] Physician recommends and patient chooses bed at    Patient to be transferred [facility]  on  06/01/12 Patient to be transferred to facility by [facility]  The following physician request were entered in Epic:   Additional Comments:  Providence Crosby, Theresia Majors Clinical Social Work [phone]

## 2012-05-30 NOTE — Progress Notes (Signed)
*  PRELIMINARY RESULTS* Echocardiogram 2D Echocardiogram has been performed.  Jeryl Columbia 05/30/2012, 11:05 AM

## 2012-05-30 NOTE — Progress Notes (Signed)
Clinical Social Work Department BRIEF PSYCHOSOCIAL ASSESSMENT 05/30/2012  Patient:  Timothy [name], Timothy [name]     Account Number:  [id number]     Admit date:  05/28/2012  Clinical Social Worker:  Doroteo Glassman  Date/Time:  05/30/2012 11:33 AM  Referred by:  Physician  Date Referred:  05/30/2012 Referred for  SNF Placement   Other Referral:   Interview type:  Other - See comment Other interview type:   Pt's wife at bedside    PSYCHOSOCIAL DATA Living Status:  FACILITY Admitted from facility:  [facility] Level of care:   Primary support name:  Delanna Ahmadi Primary support relationship to patient:  SPOUSE Degree of support available:   strong    CURRENT CONCERNS Current Concerns  Post-Acute Placement   Other Concerns:    SOCIAL WORK ASSESSMENT / PLAN Met with Pt's wife at bedside.  Pt unable to participate in the assessment.    Discussed with Pt's with SNF as an option for d/c. Pt's wife in agreement, as she feels that he needs more PT than what [facility] can offer.    Provided Mrs. Nary with SNF list.    CSW thanked Mrs Bollier for her time.   Assessment/plan status:  Psychosocial Support/Ongoing Assessment of Needs Other assessment/ plan:   Information/referral to community resources:   SNF list    PATIENT'S/FAMILY'S RESPONSE TO PLAN OF CARE: Mrs. Green thanked CSW for time and assistance.   Providence Crosby, LCSWA Clinical Social Work [phone]

## 2012-05-31 ENCOUNTER — Other Ambulatory Visit: Payer: Self-pay | Admitting: *Deleted

## 2012-05-31 DIAGNOSIS — I429 Cardiomyopathy, unspecified: Secondary | ICD-10-CM

## 2012-05-31 LAB — GLUCOSE, CAPILLARY: Glucose-Capillary: 111 mg/dL — ABNORMAL HIGH (ref 70–99)

## 2012-05-31 LAB — CBC
MCV: 90 fL (ref 78.0–100.0)
Platelets: 130 10*3/uL — ABNORMAL LOW (ref 150–400)
RBC: 4.29 MIL/uL (ref 4.22–5.81)
WBC: 5.4 10*3/uL (ref 4.0–10.5)

## 2012-05-31 LAB — BASIC METABOLIC PANEL
CO2: 21 mEq/L (ref 19–32)
Chloride: 109 mEq/L (ref 96–112)
Creatinine, Ser: 1.2 mg/dL (ref 0.50–1.35)
Sodium: 140 mEq/L (ref 135–145)

## 2012-05-31 LAB — MAGNESIUM: Magnesium: 1.8 mg/dL (ref 1.5–2.5)

## 2012-05-31 MED ORDER — MEMANTINE HCL ER 28 MG PO CP24: 1.0000 | ORAL_CAPSULE | Freq: Every day | ORAL | Status: AC

## 2012-05-31 MED ORDER — RIVAROXABAN 15 MG PO TABS
15.0000 mg | ORAL_TABLET | Freq: Two times a day (BID) | ORAL | Status: DC
  Administered 2012-05-31 – 2012-06-01 (×2): 15 mg via ORAL
  Filled 2012-05-31 (×4): qty 1

## 2012-05-31 NOTE — Progress Notes (Signed)
Physical Therapy Treatment Patient Details Name: Timothy [name] MRN: [id number] DOB: [birth year] Today's Date: 05/31/2012 Time: 7829-5621 PT Time Calculation (min): 26 min  PT Assessment / Plan / Recommendation Comments on Treatment Session  RN reported pt was confused and will need + 2 assist.  When I saked hime "what brings you to the hospital?" he responded "a car" then he laughed.  Pt aware he is here for PNA.  Assisted OOB with increased time as he like to stall and talk.  Amb + 2 assist for safety as he was unsteady and slow.  Pt progressing slowly and would benefit from ST Rehab at SNF before returning to ALF.    Follow Up Recommendations  SNF     Does the patient have the potential to tolerate intense rehabilitation     Barriers to Discharge        Equipment Recommendations  None recommended by PT    Recommendations for Other Services    Frequency Min 3X/week   Plan      Precautions / Restrictions Precautions Precautions: Fall Restrictions Weight Bearing Restrictions: No   Pertinent Vitals/Pain No c/o pain    Mobility  Bed Mobility Bed Mobility: Supine to Sit Supine to Sit: 4: Min guard;4: Min assist Details for Bed Mobility Assistance: increased time and repeat functional cueing to stay on task as pt was easily distracted and chatty. Transfers Transfers: Sit to Stand;Stand to Sit Sit to Stand: 1: +2 Total assist;From bed;From elevated surface Sit to Stand: Patient Percentage: 50% Stand to Sit: 1: +2 Total assist;To elevated surface;To bed Stand to Sit: Patient Percentage: 40% Details for Transfer Assistance: 75% VC's on proper tech and hand placement plus forward lean as pt demon max posterior LOB. Ambulation/Gait Ambulation/Gait Assistance: 1: +2 Total assist Ambulation/Gait: Patient Percentage: 70% Ambulation Distance (Feet): 62 Feet Assistive device: Rolling walker Ambulation/Gait Assistance Details: 50% VC's on proper posture, proper walker to self  distance and increased time.  Initial posterior lean but decreased with increased distance.  Unsteady with very short step lengths and noted limited l ankle dorsiflexion.  Pt demon increased steppage to combinsate. Gait Pattern: Decreased step length - left;Decreased step length - right;Narrow base of support Gait velocity: very slow gait     PT Goals                                                     Progressing slowly    Visit Information  Last PT Received On: 05/31/12 Assistance Needed: +2    Subjective Data  Subjective: When I asked him, "What brings you to the hospital? pt replied "a car". Patient Stated Goal: home   Cognition       Balance   poor  End of Session PT - End of Session Equipment Utilized During Treatment: Gait belt Activity Tolerance: Patient limited by fatigue Patient left: in chair;with call bell/phone within reach   Felecia Shelling  PTA [facility]  Acute  Rehab Pager      [phone]

## 2012-05-31 NOTE — Progress Notes (Signed)
[facility] PROGRESS NOTE  Timothy [name] ZOX:[id number] DOB: 1924-02-22 DOA: 05/28/2012 PCP: Carrie Mew, MD  Assessment/Plan:  Abdominal pain, other specified site  -RESOLVED.  - continue IV fluids, antiemetics and analgesia with morphine 1 mg Q 4 hours PRN IV  - may advance diet as toelrated  Active Problems:  Acute Pancreatitis  -appears to have resolved.  DEMENTIA, ADVANCED  - continue namenda and aricept  Pulmonary embolism  - lovenox and coumadin per pharmacy  - venous duplex ordered and negative for DVT - echo showed LVEF 40%, with inferior and posterior hypokinesis. No right heart strain evident on the echocardiogram.  -sub therapeutic INR.  CAP (community acquired pneumonia)  - started levaquin 750 mg IV daily  Dementia: continue with aricept and namenda.    DVT prophylaxis: on therapeutic lovenox.   FULL CODE Discussed with spouse who wants to change to xarelto.   HPI/Subjective: Comfortable. No new complaints.   Objective: Filed Vitals:   05/30/12 1630 05/30/12 2200 05/31/12 0600 05/31/12 1526  BP: 111/75 119/73 121/82 125/85  Pulse: 66 68 78 82  Temp: 97.9 F (36.6 C) 98.4 F (36.9 C) 97.9 F (36.6 C) 98.3 F (36.8 C)  TempSrc: Oral Axillary Oral Oral  Resp: 16 18 18 16   Height:      Weight:      SpO2: 95% 95% 95% 98%    Intake/Output Summary (Last 24 hours) at 05/31/12 1605 Last data filed at 05/31/12 0927  Gross per 24 hour  Intake    825 ml  Output    250 ml  Net    575 ml   Filed Weights   05/28/12 1740 05/28/12 1740 05/28/12 2007  Weight: 87.499 kg (192 lb 14.4 oz) 87.499 kg (192 lb 14.4 oz) 87.091 kg (192 lb)    Exam: Alert afebrile comfortable.  CVS: RRR, S1/S2 +, no murmurs, no gallops, no carotid bruit.  Pulmonary: Effort and breath sounds normal, no stridor, rhonchi, wheezes, rales.  Abdominal: Soft. BS +, no distension, tenderness, rebound or guarding.  Musculoskeletal: Normal range of motion. No edema and no  tenderness.  Neuro: Alert. Normal reflexes, muscle tone coordination. No cranial nerve deficit.   Data Reviewed: Basic Metabolic Panel:  Recent Labs Lab 05/28/12 1525 05/28/12 2048 05/29/12 0555 05/31/12 0515  NA 136 135 136 140  K 4.4 4.2 4.1 4.2  CL 102 99 101 109  CO2 22 24 26 21   GLUCOSE 128* 109* 93 114*  BUN 31* 28* 26* 23  CREATININE 1.30 1.32 1.23 1.20  CALCIUM 8.8 9.1 8.7 8.3*  MG  --  2.0  --  1.8  PHOS  --  3.2  --   --    Liver Function Tests:  Recent Labs Lab 05/28/12 1525 05/28/12 2048 05/29/12 0555  AST 14 14 11   ALT 14 14 13   ALKPHOS 102 109 98  BILITOT 0.7 0.8 0.7  PROT 6.4 6.6 6.0  ALBUMIN 3.0* 3.1* 2.8*    Recent Labs Lab 05/28/12 1525 05/29/12 0555  LIPASE 73* 31   No results found for this basename: AMMONIA,  in the last 168 hours CBC:  Recent Labs Lab 05/28/12 1525 05/28/12 2048 05/29/12 0555 05/31/12 0515  WBC 9.2 9.6 6.8 5.4  NEUTROABS 6.2 5.9  --   --   HGB 13.4 14.2 13.4 12.4*  HCT 41.8 44.4 41.3 38.6*  MCV 91.1 91.2 90.8 90.0  PLT 104* 117* 110* 130*   Cardiac Enzymes: No results found for this basename:  CKTOTAL, CKMB, CKMBINDEX, TROPONINI,  in the last 168 hours BNP (last 3 results) No results found for this basename: PROBNP,  in the last 8760 hours CBG:  Recent Labs Lab 05/29/12 0727 05/30/12 0723 05/31/12 0755  GLUCAP 84 79 111*    No results found for this or any previous visit (from the past 240 hour(s)).   Studies: No results found.  Scheduled Meds: . carvedilol  3.125 mg Oral BID WC  . citalopram  10 mg Oral Daily  . colchicine  0.6 mg Oral BID  . donepezil  10 mg Oral QHS  . levofloxacin (LEVAQUIN) IV  750 mg Intravenous Q48H  . lisinopril  2.5 mg Oral Daily  . memantine  10 mg Oral BID  . rivaroxaban  15 mg Oral BID WC  . sodium chloride  3 mL Intravenous Q12H   Continuous Infusions: . sodium chloride 75 mL/hr at 05/31/12 1021    Principal Problem:   Abdominal  pain, other specified  site Active Problems:   HYPERLIPIDEMIA   HYPERTENSION   DEMENTIA, ADVANCED   Pulmonary embolism   Pancreatitis   CAP (community acquired pneumonia)   Abnormal echocardiogram    Time spent: 30 minutes     Sonia Stickels  [facility] Pager [phone]. If 7PM-7AM, please contact night-coverage at [email or web address], password [facility] 05/31/2012, 4:05 PM  LOS: 3 days

## 2012-05-31 NOTE — Consult Note (Signed)
Patient examined chart reviewed. Advanced dementia with no clinical cardiac symptoms ECG with no old infarct isolated Q in 3.  No indication for further cardiac w/u.  Continue beta blocker And ACE.  Exam remarkable for foley catheter, no murmur and clear lung fields.  Abdominal aorta Palpable but not tender  Charlton Haws

## 2012-05-31 NOTE — Progress Notes (Signed)
Clinical Social Work  CSW met with patient in room and provided bed offers. Per chart review, patient is confused at times. CSW called patient's wife and explained bed offers over the phone and explained that list was left with patient. Patient and wife to review information before making a decision. CSW will continue to follow.  [place], [place] [phone]

## 2012-05-31 NOTE — Consult Note (Signed)
CARDIOLOGY CONSULT NOTE  Patient ID: Timothy [name], MRN: [id number], DOB/AGE: 1924/05/21 79 y.o. Admit date: 05/28/2012   Date of Consult: 05/31/2012 Primary Physician: Carrie Mew, MD Primary Cardiologist: new to [facility]  Chief Complaint: abdominal pain Reason for Consult: abnormal echo with decreased EF 40% of unclear duration, also found to have PE and AAA this admission  HPI: Timothy [name] is an 79 y/o M NHP with history of HTN, HL stroke, dementia who presented to [facility] with complaints of worsening abdominal pain for the past day with associated cough. He is a poor historian and unable to tell me why he's here. [place] in [place] and he thinks he decided it's time he better go ahead and just get checked out. He had been on Avelox at his nuring home for possible pneumonia. On arrival to the ER, lipase was mildly elevated and he was diagnosed with pancreatitis. CT abd/pelvis demonstrated sigmoid diverticulosis without evidence of diverticulitis, probable small infiltrate LLL, 4.4 x 4.0 x 8.4 cm distal abdominal aortic aneurysm without aneurysmal leak, perianeurysmal infiltration or fibrosis, and incidentally showed a large R lung PE. He has subsequently been started Xarelto. LE dopp neg. We have been consulted for abnormal echo (limited windows) showing EF 40% with inferior and posterior hypokinesis, mild LVH, grade 1 diastolic dysfunction. The primary team has started him on Coreg and Lisinopril. He does endorse sporadic chest pressure when he takes a deep breath in. He denies any SOB, nausea, vomiting, LEE, orthopnea, palpitations or syncope (to the best of his knowledge). He currently denies any symptoms.  Past Medical History  Diagnosis Date  . Hypertension   . Arthritis   . BPH (benign prostatic hypertrophy)   . Carpal tunnel syndrome   . Hyperlipidemia   . Stroke   . Diverticulosis   . Dementia   . Complication of anesthesia    unknown      Most Recent Cardiac Studies: 2D Echo 05/30/12  Study Conclusions - Left ventricle: Poor acoustic windows limit study.LVEF is approximately 40% with inferior and posterior hypokinesis. The cavity size was normal. Wall thickness was increased in a pattern of mild LVH. Doppler parameters are consistent with abnormal left ventricular relaxation (grade 1 diastolic dysfunction). - Left atrium: The atrium was mildly dilated.   Surgical History:  Past Surgical History  Procedure Laterality Date  . Appendectomy    . Carpal tunnel release    . Hip replacement-left    . Joint replacement    . Rt knee replacement       Home Meds: Prior to Admission medications   Medication Sig Start Date End Date Taking? Authorizing Provider  citalopram (CELEXA) 10 MG tablet Take 1 tablet (10 mg total) by mouth daily. 05/26/12  Yes Stacie Glaze, MD  colchicine 0.6 MG tablet Take 1 tablet (0.6 mg total) by mouth 2 (two) times daily. 05/26/12  Yes Stacie Glaze, MD  donepezil (ARICEPT) 10 MG tablet Take 10 mg by mouth at bedtime.  05/26/12  Yes Stacie Glaze, MD  HYDROcodone-homatropine [facility]) 5-1.5 MG/5ML syrup Take 5 mLs by mouth every 8 (eight) hours as needed for cough. 05/27/12  Yes Stacie Glaze, MD  memantine (NAMENDA) 10 MG tablet Take 10 mg by mouth 2 (two) times daily.   Yes Historical Provider, MD  moxifloxacin (AVELOX) 400 MG tablet Take 400 mg by mouth daily. 10 day course started 05/27/12. 05/27/12 06/06/12 Yes Historical  Provider, MD    Inpatient Medications:  . carvedilol  3.125 mg Oral BID WC  . citalopram  10 mg Oral Daily  . colchicine  0.6 mg Oral BID  . donepezil  10 mg Oral QHS  . levofloxacin (LEVAQUIN) IV  750 mg Intravenous Q48H  . lisinopril  2.5 mg Oral Daily  . memantine  10 mg Oral BID  . rivaroxaban  15 mg Oral BID WC  . sodium chloride  3 mL Intravenous Q12H    Allergies: No Known Allergies  History   Social History  . Marital Status: Married    Spouse  Name: N/A    Number of Children: N/A  . Years of Education: N/A   Occupational History  . Not on file.   Social History Main Topics  . Smoking status: Never Smoker   . Smokeless tobacco: Never Used  . Alcohol Use: No  . Drug Use: No  . Sexually Active: No   Other Topics Concern  . Not on file   Social History Narrative  . No narrative on file     Family History  Problem Relation Age of Onset  . Stroke Mother   . Heart disease Mother   . Diabetes Mother      Review of Systems: questionable accuracy given pt's dementia General: negative for chills, fever Cardiovascular: see above Dermatological: negative for rash Respiratory: negative for cough or wheezing Urologic: negative for hematuria Abdominal: negative for nausea, vomiting, diarrhea, bright red blood per rectum, melena, or hematemesis Neurologic: negative for visual changes, syncope, or dizziness All other systems reviewed and are otherwise negative except as noted above.  Labs:  Lab Results  Component Value Date   WBC 5.4 05/31/2012   HGB 12.4* 05/31/2012   HCT 38.6* 05/31/2012   MCV 90.0 05/31/2012   PLT 130* 05/31/2012    Recent Labs Lab 05/29/12 0555 05/31/12 0515  NA 136 140  K 4.1 4.2  CL 101 109  CO2 26 21  BUN 26* 23  CREATININE 1.23 1.20  CALCIUM 8.7 8.3*  PROT 6.0  --   BILITOT 0.7  --   ALKPHOS 98  --   ALT 13  --   AST 11  --   GLUCOSE 93 114*   Lab Results  Component Value Date   CHOL 185 05/25/2009   HDL 45.90 05/25/2009   LDLCALC 137* 11/03/2007   TRIG 93 11/03/2007   Radiology/Studies:  Dg Chest 2 View 05/28/2012  *RADIOLOGY REPORT*  Clinical Data: Abdominal pain  CHEST - 2 VIEW  Comparison: 07/22/2011  Findings: Cardiomediastinal silhouette is stable.  There is small left pleural effusion with left basilar atelectasis or infiltrate. No pulmonary edema.  IMPRESSION: .  Small left pleural effusion with left basilar atelectasis or infiltrate.  No pulmonary edema.   Original Report  Authenticated By: Natasha Mead, M.D.    Ct Abdomen Pelvis W Contrast 05/28/2012  *RADIOLOGY REPORT*  Clinical Data: Check, left upper quadrant pain, change in level of consciousness, abdominal distention, decreased bowel sounds, past history hypertension  CT ABDOMEN AND PELVIS WITH CONTRAST  Technique:  Multidetector CT imaging of the abdomen and pelvis was performed following the standard protocol during bolus administration of intravenous contrast. Sagittal and coronal MPR images reconstructed from axial data set.  Contrast: OMNIPAQUE IOHEXOL 300 MG/ML  SOLN Dilute oral contrast.  Comparison: None  Findings: Small left pleural effusion with bibasilar atelectasis and suspect small focus of left basilar infiltrate. Large filling defect identified  at bifurcation of right pulmonary artery consistent with a large pulmonary embolus. Questionable small filling defect within a left lower lobe pulmonary artery versus artifact, incompletely assessed, images 1- 2.  Liver, spleen, atrophic pancreas, and kidneys unremarkable. Mildly thickened bilateral adrenal glands without discrete nodule. Extensive atherosclerotic calcifications with aneurysmal dilatation of the distal abdominal aorta up to 4.4 x 4.0 cm image 48, approximately 8.4 cm length. Aneurysm extends to but not through aortic bifurcation. No evidence of aneurysm leak, perianeurysmal infiltration or fibrosis.  Bilateral inguinal hernias containing fat. Well-distended unremarkable bladder. Portions of pelvis obscured by beam hardening artifacts from a left hip prosthesis. Prostatic enlargement gland with 6.0 x 5.0 by 5.6 cm. Sigmoid diverticulosis without evidence of diverticulitis. Upper normal-sized left common iliac lymph node 10 mm short axis image 52.  No additional mass, adenopathy, free fluid or inflammatory process. Short segment of the mid transverse colon shows questionable wall thickening versus artifact from underdistension. Stomach remaining bowel  loops normal appearance. Severe multilevel degenerative disc and facet disease changes lumbar spine.  IMPRESSION: Large pulmonary embolus at the bifurcation of the right pulmonary artery.  4.4 x 4.0 x 8.4 cm distal abdominal aortic aneurysm without evidence of rupture/leak. Sigmoid diverticulosis without evidence of diverticulitis. Small left pleural effusion with bibasilar atelectasis and probable small focus of infiltrate left lower lobe.  Critical Value/emergent results were called by telephone at the time of interpretation on 05/28/2012 at 1657 hours to Dr. Ethelda Chick, who verbally acknowledged these results.   Original Report Authenticated By: Ulyses Southward, M.D.    EKG: 5/2: NSR 92bpm frequent multifocal PVCs, no acute changes 05/29/12: NSR 73bpm TWI III, otherwise no acute changes, not significantly changed from prior Tele has shown rare episodes of WCT most recent appearing 10 beats NSVT  Physical Exam: Blood pressure 121/82, pulse 78, temperature 97.9 F (36.6 C), temperature source Oral, resp. rate 18, height 6' (1.829 m), weight 192 lb (87.091 kg), SpO2 95.00%. General: Well developed, well nourished elderly WM in no acute distress. Head: Normocephalic, atraumatic, sclera non-icteric, no xanthomas, nares are without discharge.  Neck: Negative for carotid bruits. JVD not elevated. Lungs: Slightly diminished BS throughout but no wheezes, rales, or rhonchi. Breathing is unlabored. Heart: RRR with S1, split s2, freq ectopy. No murmurs, rubs, or gallops appreciated. Abdomen: Soft, non-tender, non-distended with normoactive bowel sounds. No hepatomegaly. No rebound/guarding. No obvious abdominal masses. Msk:  Strength and tone appear normal for age. Extremities: No clubbing or cyanosis. Cool but dry. No edema.  Distal pedal pulses are 2+ and equal bilaterally. Neuro: Alert and oriented to self, thinks it's Oct-Nov 1949/1959?, knows he's at [facility] hospital but unclear why. No facial asymmetry. No focal  deficit. Moves all extremities spontaneously. Psych:  Responds to questions appropriately with a pleasantly demented affect.   Assessment and Plan:   1. Abdominal pain with pancreatitis 2. AAA measuring 4.4 x 4.0 cm (8.4cm in length) 3. Newly diagnosed R lung PE 4. LV dysfunction EF 40% 5. Advanced dementia 6. ? LLL infiltrate 7. NSVT 8. HTN  Favor conservative management of LV dysfunction of uncertain chronicity in light of newly diagnosed PE, AAA, and dementia. Agree with initiation of ACEI and BB which was done today. He appears euvolemic at this time. Caution with IVF. With NSVT, keep K>4, Mg >2. Dr. Eden Emms to follow.  Signed, Ronie Spies PA-C 05/31/2012, 1:43 PM

## 2012-06-01 DIAGNOSIS — R9389 Abnormal findings on diagnostic imaging of other specified body structures: Secondary | ICD-10-CM

## 2012-06-01 LAB — GLUCOSE, CAPILLARY: Glucose-Capillary: 91 mg/dL (ref 70–99)

## 2012-06-01 MED ORDER — RIVAROXABAN 20 MG PO TABS: 20.0000 mg | ORAL_TABLET | Freq: Every day | ORAL | Status: AC

## 2012-06-01 MED ORDER — RIVAROXABAN 15 MG PO TABS: 15.0000 mg | ORAL_TABLET | Freq: Two times a day (BID) | ORAL | Status: DC

## 2012-06-01 MED ORDER — CARVEDILOL 3.125 MG PO TABS: 3.1250 mg | ORAL_TABLET | Freq: Two times a day (BID) | ORAL | Status: AC

## 2012-06-01 MED ORDER — LEVOFLOXACIN 500 MG PO TABS: 500.0000 mg | ORAL_TABLET | Freq: Every day | ORAL | Status: DC

## 2012-06-01 MED ORDER — LISINOPRIL 2.5 MG PO TABS: 2.5000 mg | ORAL_TABLET | Freq: Every day | ORAL | Status: DC

## 2012-06-01 NOTE — Progress Notes (Signed)
Clinical Social Work  CSW faxed DC summary to [place] who is agreeable to admission today and reports that wife has already completed paperwork. CSW prepared DC packet with FL2 included. CSW informed patient, RN and left a message with wife regarding transfer occurring around 2pm. CSW coordinated transportation via [employer] Engineer, maintenance # [phone]). CSW is signing off but available if further needs arise.  [place], [place] [phone]

## 2012-06-01 NOTE — Discharge Summary (Addendum)
Physician Discharge Summary  Timothy [name] ZOX:[id number] DOB: 1924-02-06 DOA: 05/28/2012  PCP: Carrie Mew, MD  Admit date: 05/28/2012 Discharge date: 06/01/2012  Time spent: 35 minutes  Recommendations for Outpatient Follow-up:  1. Follow up with PCP in one week.   Discharge Diagnoses:  Principal Problem:   Abdominal  pain, other specified site Active Problems:   HYPERLIPIDEMIA   HYPERTENSION   DEMENTIA, ADVANCED   Pulmonary embolism   Pancreatitis   CAP (community acquired pneumonia)   Abnormal echocardiogram   Discharge Condition: fair  Diet recommendation: regular diet  Filed Weights   05/28/12 1740 05/28/12 1740 05/28/12 2007  Weight: 87.499 kg (192 lb 14.4 oz) 87.499 kg (192 lb 14.4 oz) 87.091 kg (192 lb)    History of present illness:  79 year old male with past medical history significant for dementia, from nursing home, how presented to [facility] ED 05/28/2012 with worsening abdominal pain for past 1 day prior to this admission. Patient is not a good historian due to his history of dementia. History is provided from the ED physician. Family is not at the bedside at the moment to provide history either. There was apparently associated cough reported in NH but no fever although patient was on avelox in NH for possible pneumonia.  Evaluation in ED included CT abdomen/pelvis which was significant for diverticulosis but not diverticulitis. More importantly, there was a significant finding of large pulmonary embolism iat the bifurcation of right pulmonary artery. Chest x ray showed left basilar infiltrate. Patient was started on Lovenox in ED.   Hospital Course:  Abdominal pain, other specified site  -RESOLVED. Possibly from mild pancreatitis. His liapse levels normalised and he is able to tolerate a regular diet without pain , nausea or vomiting.  Acute Pancreatitis  -appears to have resolved.  DEMENTIA, ADVANCED  - continue namenda and aricept  Pulmonary embolism  -  he was initially started on lovenox and coumadin and his spouse did nto want him to be on coumadin, we changed to xarelto.  - venous duplex ordered and negative for DVT  - echo showed LVEF 40%, with inferior and posterior hypokinesis. No right heart strain evident on the echocardiogram.  - incidental finding of the abdominal aneurysm. Outpatient follow up with cardiologist as recommended.  CAP (community acquired pneumonia)  - started levaquin 750 mg IV daily , and he is being discharged on 2 more days of levaquin to complete the course.  Recommend checking a CXR in one week to evaluate for resolution of the pneumonia.  Dementia: continue with aricept and namenda.    Consultations:  cardiology  Discharge Exam: Filed Vitals:   05/31/12 0600 05/31/12 1526 05/31/12 2200 06/01/12 0600  BP: 121/82 125/85 95/70 131/85  Pulse: 78 82 64 78  Temp: 97.9 F (36.6 C) 98.3 F (36.8 C) 98.1 F (36.7 C) 98.2 F (36.8 C)  TempSrc: Oral Oral Oral Oral  Resp: 18 16 18 18   Height:      Weight:      SpO2: 95% 98% 98% 99%   Alert afebrile comfortable.  CVS: RRR, S1/S2 +, no murmurs, no gallops, no carotid bruit.  Pulmonary: Effort and breath sounds normal, no stridor, rhonchi, wheezes, rales.  Abdominal: Soft. BS +, no distension, tenderness, rebound or guarding.  Musculoskeletal: Normal range of motion. No edema and no tenderness.  Neuro: Alert. Normal reflexes, muscle tone coordination. No cranial nerve deficit.    Discharge Instructions      Discharge Orders   Future  Appointments Provider Department Dept Phone   09/23/2012 11:00 AM Huston Foley, MD [facility] [phone]   Future Orders Complete By Expires     Discharge instructions  As directed     Comments:      Follow up with PCP in one week. Follow up with cardiologist as recommended.        Medication List    STOP taking these medications       HYDROcodone-homatropine 5-1.5 MG/5ML syrup  Commonly known  as:  HYCODAN     memantine 10 MG tablet  Commonly known as:  NAMENDA     moxifloxacin 400 MG tablet  Commonly known as:  AVELOX      TAKE these medications       carvedilol 3.125 MG tablet  Commonly known as:  COREG  Take 1 tablet (3.125 mg total) by mouth 2 (two) times daily with a meal.     citalopram 10 MG tablet  Commonly known as:  CELEXA  Take 1 tablet (10 mg total) by mouth daily.     colchicine 0.6 MG tablet  Take 1 tablet (0.6 mg total) by mouth 2 (two) times daily.     donepezil 10 MG tablet  Commonly known as:  ARICEPT  Take 10 mg by mouth at bedtime.     lisinopril 2.5 MG tablet  Commonly known as:  PRINIVIL,ZESTRIL  Take 1 tablet (2.5 mg total) by mouth daily.     Memantine HCl ER 28 MG Cp24  Commonly known as:  NAMENDA XR  Take 28 mg by mouth daily.     Rivaroxaban 15 MG Tabs tablet  Commonly known as:  XARELTO  Take 1 tablet (15 mg total) by mouth 2 (two) times daily with a meal.     Rivaroxaban 20 MG Tabs  Commonly known as:  XARELTO  Take 1 tablet (20 mg total) by mouth daily.  Start taking on:  06/23/2012       No Known Allergies    The results of significant diagnostics from this hospitalization (including imaging, microbiology, ancillary and laboratory) are listed below for reference.    Significant Diagnostic Studies: Dg Chest 2 View  05/28/2012  *RADIOLOGY REPORT*  Clinical Data: Abdominal pain  CHEST - 2 VIEW  Comparison: 07/22/2011  Findings: Cardiomediastinal silhouette is stable.  There is small left pleural effusion with left basilar atelectasis or infiltrate. No pulmonary edema.  IMPRESSION: .  Small left pleural effusion with left basilar atelectasis or infiltrate.  No pulmonary edema.   Original Report Authenticated By: Natasha Mead, M.D.    Ct Abdomen Pelvis W Contrast  05/28/2012  *RADIOLOGY REPORT*  Clinical Data: Check, left upper quadrant pain, change in level of consciousness, abdominal distention, decreased bowel sounds, past  history hypertension  CT ABDOMEN AND PELVIS WITH CONTRAST  Technique:  Multidetector CT imaging of the abdomen and pelvis was performed following the standard protocol during bolus administration of intravenous contrast. Sagittal and coronal MPR images reconstructed from axial data set.  Contrast: OMNIPAQUE IOHEXOL 300 MG/ML  SOLN Dilute oral contrast.  Comparison: None  Findings: Small left pleural effusion with bibasilar atelectasis and suspect small focus of left basilar infiltrate. Large filling defect identified at bifurcation of right pulmonary artery consistent with a large pulmonary embolus. Questionable small filling defect within a left lower lobe pulmonary artery versus artifact, incompletely assessed, images 1- 2.  Liver, spleen, atrophic pancreas, and kidneys unremarkable. Mildly thickened bilateral adrenal glands without discrete nodule. Extensive atherosclerotic  calcifications with aneurysmal dilatation of the distal abdominal aorta up to 4.4 x 4.0 cm image 48, approximately 8.4 cm length. Aneurysm extends to but not through aortic bifurcation. No evidence of aneurysm leak, perianeurysmal infiltration or fibrosis.  Bilateral inguinal hernias containing fat. Well-distended unremarkable bladder. Portions of pelvis obscured by beam hardening artifacts from a left hip prosthesis. Prostatic enlargement gland with 6.0 x 5.0 by 5.6 cm. Sigmoid diverticulosis without evidence of diverticulitis. Upper normal-sized left common iliac lymph node 10 mm short axis image 52.  No additional mass, adenopathy, free fluid or inflammatory process. Short segment of the mid transverse colon shows questionable wall thickening versus artifact from underdistension. Stomach remaining bowel loops normal appearance. Severe multilevel degenerative disc and facet disease changes lumbar spine.  IMPRESSION: Large pulmonary embolus at the bifurcation of the right pulmonary artery.  4.4 x 4.0 x 8.4 cm distal abdominal aortic  aneurysm without evidence of rupture/leak. Sigmoid diverticulosis without evidence of diverticulitis. Small left pleural effusion with bibasilar atelectasis and probable small focus of infiltrate left lower lobe.  Critical Value/emergent results were called by telephone at the time of interpretation on 05/28/2012 at 1657 hours to Dr. Ethelda Chick, who verbally acknowledged these results.   Original Report Authenticated By: Ulyses Southward, M.D.     Microbiology: No results found for this or any previous visit (from the past 240 hour(s)).   Labs: Basic Metabolic Panel:  Recent Labs Lab 05/28/12 1525 05/28/12 2048 05/29/12 0555 05/31/12 0515  NA 136 135 136 140  K 4.4 4.2 4.1 4.2  CL 102 99 101 109  CO2 22 24 26 21   GLUCOSE 128* 109* 93 114*  BUN 31* 28* 26* 23  CREATININE 1.30 1.32 1.23 1.20  CALCIUM 8.8 9.1 8.7 8.3*  MG  --  2.0  --  1.8  PHOS  --  3.2  --   --    Liver Function Tests:  Recent Labs Lab 05/28/12 1525 05/28/12 2048 05/29/12 0555  AST 14 14 11   ALT 14 14 13   ALKPHOS 102 109 98  BILITOT 0.7 0.8 0.7  PROT 6.4 6.6 6.0  ALBUMIN 3.0* 3.1* 2.8*    Recent Labs Lab 05/28/12 1525 05/29/12 0555  LIPASE 73* 31   No results found for this basename: AMMONIA,  in the last 168 hours CBC:  Recent Labs Lab 05/28/12 1525 05/28/12 2048 05/29/12 0555 05/31/12 0515  WBC 9.2 9.6 6.8 5.4  NEUTROABS 6.2 5.9  --   --   HGB 13.4 14.2 13.4 12.4*  HCT 41.8 44.4 41.3 38.6*  MCV 91.1 91.2 90.8 90.0  PLT 104* 117* 110* 130*   Cardiac Enzymes: No results found for this basename: CKTOTAL, CKMB, CKMBINDEX, TROPONINI,  in the last 168 hours BNP: BNP (last 3 results) No results found for this basename: PROBNP,  in the last 8760 hours CBG:  Recent Labs Lab 05/29/12 0727 05/30/12 0723 05/31/12 0755 06/01/12 0754  GLUCAP 84 79 111* 91       Signed:  Keyandra Swenson  [facility] 06/01/2012, 10:54 AM    Received a call from patient's Son Adam Belardo on  06/07/12 , regarding the abdominal aneurysm findings,. Directed the call to the pts wife as per the wife's recommendations,.  Kathlen Mody, MD.

## 2012-06-01 NOTE — Progress Notes (Signed)
Called report to [place], Charity fundraiser at [place]. Left number to call me if she had an additional questions.

## 2012-06-01 NOTE — Progress Notes (Signed)
Clinical Social Work  Per progression meeting, patient is ready to DC today. MD reports that wife has requested that only she make decisions on patient's behalf. Wife reports that son stated he was HCPOA but no paperwork in chart confirming this information. CSW continues to speak with wife regarding decisions due to patient remaining confused.  CSW called and spoke to wife via phone. Wife reports she has offers but has not made a decision. CSW explained that a decision would need to be made due to MD reporting that patient is medically stable for SNF. Wife reports understanding and reports she will call CSW back after making a decision.  CSW will continue to follow.  [place], [place] [phone]

## 2012-06-01 NOTE — Progress Notes (Signed)
   Subjective:  Patient is in no distress. No chest pain or dyspnea.  Objective:  Vital Signs in the last 24 hours: Temp:  [98.1 F (36.7 C)-98.3 F (36.8 C)] 98.2 F (36.8 C) (05/06 0600) Pulse Rate:  [64-82] 78 (05/06 0600) Resp:  [16-18] 18 (05/06 0600) BP: (95-131)/(70-85) 131/85 mmHg (05/06 0600) SpO2:  [98 %-99 %] 99 % (05/06 0600)  Intake/Output from previous day: 05/05 0701 - 05/06 0700 In: 120 [P.O.:120] Out: 250 [Urine:250] Intake/Output from this shift: Total I/O In: -  Out: 300 [Urine:300]  . carvedilol  3.125 mg Oral BID WC  . citalopram  10 mg Oral Daily  . colchicine  0.6 mg Oral BID  . donepezil  10 mg Oral QHS  . levofloxacin (LEVAQUIN) IV  750 mg Intravenous Q48H  . lisinopril  2.5 mg Oral Daily  . memantine  10 mg Oral BID  . rivaroxaban  15 mg Oral BID WC  . sodium chloride  3 mL Intravenous Q12H   . sodium chloride 75 mL/hr at 05/31/12 1021    Physical Exam: The patient appears to be in no distress.  Head and neck exam reveals that the pupils are equal and reactive.  The extraocular movements are full.  There is no scleral icterus.  Mouth and pharynx are benign.  No lymphadenopathy.  No carotid bruits.  The jugular venous pressure is normal.  Thyroid is not enlarged or tender.  Chest is clear to percussion and auscultation.  No rales or rhonchi.  Expansion of the chest is symmetrical.  Heart reveals no abnormal lift or heave.  First and second heart sounds are normal.  There is no murmur gallop rub or click.  The abdomen is soft and nontender.  Bowel sounds are normoactive.  There is no hepatosplenomegaly or mass.  There are no abdominal bruits.  Extremities reveal no phlebitis or edema.  Pedal pulses are good.  There is no cyanosis or clubbing.  Neurologic exam is normal strength and no lateralizing weakness.  No sensory deficits.  Integument reveals no rash  Lab Results:  Recent Labs  05/31/12 0515  WBC 5.4  HGB 12.4*  PLT 130*     Recent Labs  05/31/12 0515  NA 140  K 4.2  CL 109  CO2 21  GLUCOSE 114*  BUN 23  CREATININE 1.20   No results found for this basename: TROPONINI, CK, MB,  in the last 72 hours Hepatic Function Panel No results found for this basename: PROT, ALBUMIN, AST, ALT, ALKPHOS, BILITOT, BILIDIR, IBILI,  in the last 72 hours No results found for this basename: CHOL,  in the last 72 hours No results found for this basename: PROTIME,  in the last 72 hours  Imaging: Imaging results have been reviewed  Cardiac Studies: Telemetry shows NSR Assessment/Plan:  1. Abdominal pain with pancreatitis  2. AAA measuring 4.4 x 4.0 cm (8.4cm in length)  3. Newly diagnosed R lung PE  4. LV dysfunction EF 40%  5. Advanced dementia  6. ? LLL infiltrate  7. NSVT  8. HTN  Plan: Continue BB, ACEi. No further cardiac workup at this point.  LOS: 4 days    Cassell Clement 06/01/2012, 7:57 AM

## 2012-06-01 NOTE — Progress Notes (Signed)
Clinical Social Work  CSW spoke with wife who chose [facility] for SNF. CSW confirmed bed with SNF who is agreeable to accept patient today. CSW text paged MD regarding DC plans.  [place], [place] [phone]

## 2012-06-02 ENCOUNTER — Telehealth: Payer: Self-pay | Admitting: *Deleted

## 2012-06-02 NOTE — Telephone Encounter (Signed)
Transitional care management  Admit date:05/28/2012 Discharge date:06/01/2012  Discharge diagnoses: Principal problem:  abdominal pain,other specific site Active problems:  hyperlipidemia  hypertension  dementia, advanced  pulmonary embolism  pancreatits  community acquired pneumonia  abnormal echocardiogram  Discharge condition: fair   Talked with Wanda,his nurse at [facility] rehabilitation center with [place].  She states patient is doing fairly well .  He is out and about in his wheelchair and has family members with him when we spoke. He is not having any apparent pain and appetite is good.  He is compliant with all of his  Medication.  He is currently at [facility] for [facility] and for skilled care treatments.    He has f/u appointment with dr Lovell Sheehan on [phone]  At 2:30 and wife is aware of appointment

## 2012-06-07 ENCOUNTER — Telehealth: Payer: Self-pay | Admitting: Internal Medicine

## 2012-06-07 ENCOUNTER — Ambulatory Visit: Payer: Medicare Other | Admitting: Internal Medicine

## 2012-06-07 NOTE — Telephone Encounter (Signed)
Wife states Timothy [name] is refusing to allow pt to see Dr Lovell Sheehan today.  Mrs Rodriges said  He can only see their MD, Dr Neva Seat.  Pt will not be at his appt.

## 2012-06-10 ENCOUNTER — Ambulatory Visit: Payer: Medicare Other | Admitting: Internal Medicine

## 2012-06-23 ENCOUNTER — Non-Acute Institutional Stay (SKILLED_NURSING_FACILITY): Payer: Medicare Other | Admitting: Nurse Practitioner

## 2012-06-23 ENCOUNTER — Encounter: Payer: Self-pay | Admitting: Nurse Practitioner

## 2012-06-23 DIAGNOSIS — F068 Other specified mental disorders due to known physiological condition: Secondary | ICD-10-CM

## 2012-06-23 DIAGNOSIS — R5381 Other malaise: Secondary | ICD-10-CM

## 2012-06-23 DIAGNOSIS — J189 Pneumonia, unspecified organism: Secondary | ICD-10-CM

## 2012-06-23 DIAGNOSIS — I10 Essential (primary) hypertension: Secondary | ICD-10-CM

## 2012-06-23 DIAGNOSIS — I2699 Other pulmonary embolism without acute cor pulmonale: Secondary | ICD-10-CM

## 2012-06-23 NOTE — Assessment & Plan Note (Addendum)
Stable- To cont on xarelto 20 mg daily- will follow up with PCP -- of note:   There was an incidental finding of the abdominal aneurysm. Outpatient follow up with cardiologist was recommended.

## 2012-06-23 NOTE — Assessment & Plan Note (Signed)
pts dementia is stable on current medications.

## 2012-06-23 NOTE — Progress Notes (Signed)
Patient ID: Timothy [name], male   DOB: [birth year], 79 y.o.   MRN: [id number]  Nursing Home Location:  [facility]   Place of Service: SNF (31)   Chief Complaint: AV: evaluation for discharge   HPI:  79 year old male with past medical history significant for dementia, from nursing home, went to hospital due to abdominal pain which was questionably pancreatitis but it quickly resolved pt was dx with CAP and treated with Levaquin. It was also found that pt had alarge pulmonary embolism iat the bifurcation of right pulmonary artery. Chest x ray showed left basilar infiltrate. Patient was started on Lovenox in ED- he is now on xarelto 20 mg daily.  Pt is currently at [facility] for STR and now ready to be discharged back to assisted living with home health therapies   Review of Systems:  ROS limited due to pts dementia Review of Systems  Respiratory: Negative for shortness of breath.   Cardiovascular: Negative for chest pain and palpitations.  Gastrointestinal: Negative for abdominal pain, diarrhea and constipation.  Genitourinary: Negative for dysuria.  Musculoskeletal: Negative for myalgias.  Skin: Negative.   Psychiatric/Behavioral: Positive for memory loss.     Medications: Patient's Medications  New Prescriptions   No medications on file  Previous Medications   CARVEDILOL (COREG) 3.125 MG TABLET    Take 1 tablet (3.125 mg total) by mouth 2 (two) times daily with a meal.   CITALOPRAM (CELEXA) 10 MG TABLET    Take 1 tablet (10 mg total) by mouth daily.   COLCHICINE 0.6 MG TABLET    Take 1 tablet (0.6 mg total) by mouth 2 (two) times daily.   DONEPEZIL (ARICEPT) 10 MG TABLET    Take 10 mg by mouth at bedtime.    LISINOPRIL (PRINIVIL,ZESTRIL) 2.5 MG TABLET    Take 1 tablet (2.5 mg total) by mouth daily.   MEMANTINE HCL ER (NAMENDA XR) 28 MG CP24    Take 28 mg by mouth daily.   RIVAROXABAN (XARELTO) 20 MG TABS    Take 1 tablet (20 mg total) by mouth daily.  Modified  Medications   No medications on file  Discontinued Medications   LEVOFLOXACIN (LEVAQUIN) 500 MG TABLET    Take 1 tablet (500 mg total) by mouth daily.   RIVAROXABAN (XARELTO) 15 MG TABS TABLET    Take 1 tablet (15 mg total) by mouth 2 (two) times daily with a meal.     Physical Exam:  Filed Vitals:   06/23/12 0931  BP: 104/68  Pulse: 65  Temp: 97.3 F (36.3 C)  Resp: 20    Physical Exam  Constitutional: He appears well-developed and well-nourished. No distress.  HENT:  Head: Normocephalic and atraumatic.  Eyes: EOM are normal. Pupils are equal, round, and reactive to light.  Neck: Normal range of motion. Neck supple.  Cardiovascular: Normal rate, regular rhythm and normal heart sounds.   Pulmonary/Chest: Effort normal and breath sounds normal. No respiratory distress.  Abdominal: Soft. Bowel sounds are normal. He exhibits no distension.  Musculoskeletal: He exhibits no edema and no tenderness.  Neurological: He is alert.  Skin: Skin is warm and dry. He is not diaphoretic.  Psychiatric: He exhibits abnormal recent memory and abnormal remote memory.    Labs reviewed: Basic Metabolic Panel       Result: 06/04/2012 3:02 PM    ( Status: F )       C     Sodium  141  135-145  mEq/L  SLN       Potassium  3.9        3.5-5.3  mEq/L  SLN       Chloride  111        96-112  mEq/L  SLN       CO2  21        19-32  mEq/L  SLN       Glucose  83        70-99  mg/dL  SLN       BUN  26     H  6-23  mg/dL  SLN       Creatinine  1.29        0.50-1.35  mg/dL  SLN       Calcium  8.6        8.4-10.5  mg/dL       Assessment/Plan Pulmonary embolism Stable- To cont on xarelto 20 mg daily- will follow up with PCP -- of note:   There was an incidental finding of the abdominal aneurysm. Outpatient follow up with cardiologist was recommended.   DEMENTIA, ADVANCED pts dementia is stable on current medications.  HYPERTENSION Patients hypertension is stable; continue current regimen.  CAP  (community acquired pneumonia) Resolved- Finished antibiotic in hospital. No furthers symptoms   Debility pt is stable for discharge-will need PT/OT per home health. No DME needed.  will need to follow up with PCP within a few weeks.

## 2012-06-23 NOTE — Assessment & Plan Note (Signed)
pt is stable for discharge-will need PT/OT per home health. No DME needed.  will need to follow up with PCP within a few weeks.

## 2012-06-23 NOTE — Assessment & Plan Note (Signed)
Patients hypertension is stable; continue current regimen.

## 2012-06-23 NOTE — Assessment & Plan Note (Signed)
Resolved- Finished antibiotic in hospital. No furthers symptoms

## 2012-06-28 DIAGNOSIS — I2699 Other pulmonary embolism without acute cor pulmonale: Secondary | ICD-10-CM

## 2012-06-28 DIAGNOSIS — R269 Unspecified abnormalities of gait and mobility: Secondary | ICD-10-CM

## 2012-06-28 DIAGNOSIS — IMO0001 Reserved for inherently not codable concepts without codable children: Secondary | ICD-10-CM

## 2012-06-28 DIAGNOSIS — M6281 Muscle weakness (generalized): Secondary | ICD-10-CM

## 2012-07-05 ENCOUNTER — Telehealth: Payer: Self-pay | Admitting: Internal Medicine

## 2012-07-05 MED ORDER — CEFDINIR 300 MG PO CAPS: 300.0000 mg | ORAL_CAPSULE | Freq: Two times a day (BID) | ORAL | Status: DC

## 2012-07-05 NOTE — Telephone Encounter (Signed)
This was already in epic and script had been faxed to [facility] and Left message on machine On rns office phone to look for script

## 2012-07-05 NOTE — Telephone Encounter (Signed)
[facility] Triage Call Report Triage Record Num: [id number] Operator: Maryfrances Bunnell Patient Name: Timothy [name] Call Date & Time: 07/04/2012 12:34:20PM Patient Phone: [phone] PCP: Darryll Capers Patient Gender: Male PCP Fax : [phone] Patient DOB: April 26, 1924 [facility] Name[facility] Reason for Call: Caller: Nichole/RN; PCP: Darryll Capers (Adults only); CB#: [phone]; Call regarding Nasal congestion; Cough; Onset 07/04/12; Recent v/s: 100/65; HR 76; RR 16; T 97.3; Cough is non-productive; Clear nasal drainage; Per Upper Respiratory Infection Protocol "New onset of the following symptoms: nasal congestion; runny nose; sneezing; itchy or mild sore throat. May also have cough; irritated eyes or a mild headache or a low grade fever up to 101.5." Home care advice per disposition given. Nurse states Wife would like something started now; MD on call paged and orders received for CXR and Mucinex [phone] mg Q 12 hours prn; Follow-up with Dr. Lovell Sheehan as needed. Protocol(s) Used: Upper Respiratory Infection (URI) Recommended Outcome per Protocol: Provide Home/Self Care Reason for Outcome: New onset of the following symptoms: nasal congestion; runny nose; sneezing; itchy or mild sore throat. May also have cough; irritated eyes or a mild headache or a low grade fever up to 101.5 F (38.6C). Care Advice: ~ Call provider if symptoms worsen or new symptoms develop. ~ Consider use of a saline nasal spray per package directions to help relieve nasal congestion. Analgesic/Antipyretic Advice - Acetaminophen: Consider acetaminophen as directed on label or by pharmacist/provider for pain or fever PRECAUTIONS: - Use if there is no history of liver disease, alcoholism, or intake of three or more alcohol drinks per day - Only if approved by provider during pregnancy or when breastfeeding - During pregnancy, acetaminophen should not be taken more than 3 consecutive days without telling  provider - Do not exceed recommended dose or frequency ~ 07/04/2012 12:57:00PM Page 1 of 1 [email or web address]

## 2012-07-05 NOTE — Telephone Encounter (Signed)
[facility] Triage Call Report Triage Record Num: [id number] Operator: Baldomero Lamy Patient Name: Timothy [name] Call Date & Time: 07/04/2012 12:12:31PM Patient Phone: [phone] PCP: Darryll Capers Patient Gender: Male PCP Fax : [phone] Patient DOB: October 26, 1924 [facility] Name[facility] Reason for Call: Caller: Nicole/RN; PCP: Darryll Capers (Adults only); CB#: [phone]; Call regarding Cold symptoms; Returned call-lm on unidentified voice mail. Protocol(s) Used: No Contact or Duplicate Contact Calls (Adult) Recommended Outcome per Protocol: No Contact Reason for Outcome: Message left on unidentified answering machine. Answering service notified Care Advice: ~ 06/08/

## 2012-07-05 NOTE — Telephone Encounter (Signed)
Patient Information:  Caller Name: Joni Reining  Phone: [phone]  Patient: Timothy [name], Timothy [name]  Gender: Male  DOB: [birth year]  Age: 79 Years  PCP: Darryll Capers (Adults only)  Office Follow Up:  Does the office need to follow up with this patient?: Yes  Instructions For The Office: Please f/u and make sure CHX and Omnicef order are noted.  RN Note:  PLEASE F/U TO MAKE SURE MD SEES NOTE AND TRIAGE FROM 07/04/12 AND OMNICEF ORDER IS NOTED IN EPIC. THANKS!  Symptoms  Reason For Call & Symptoms: Nichole, RN calling from [facility] to report CHX results from 07/04/12. Report: Probable right lower lobe infiltrates. Nichole found a fax order for Omnicef 300 mg BID for two weeks while we were talking. Nothing noted in EPIC.  Reviewed Health History In EMR: N/A  Reviewed Medications In EMR: N/A  Reviewed Allergies In EMR: N/A  Reviewed Surgeries / Procedures: N/A  Date of Onset of Symptoms: 07/05/2012  Guideline(s) Used:  No Protocol Available - Sick Adult  Disposition Per Guideline:   Discuss with PCP and Callback by Nurse Today  Reason For Disposition Reached:   Nursing judgment  Advice Given:  N/A  Patient Will Follow Care Advice:  YES

## 2012-07-08 ENCOUNTER — Telehealth: Payer: Self-pay | Admitting: Internal Medicine

## 2012-07-08 NOTE — Telephone Encounter (Signed)
Nurse from [facility] called and stated that pt's BP was 90/50 when she arrived at his home. She stated that after some exercises he got it up to 105/65. FYI.

## 2012-07-09 ENCOUNTER — Ambulatory Visit (INDEPENDENT_AMBULATORY_CARE_PROVIDER_SITE_OTHER): Payer: Medicare Other | Admitting: Internal Medicine

## 2012-07-09 ENCOUNTER — Encounter: Payer: Self-pay | Admitting: Internal Medicine

## 2012-07-09 VITALS — BP 130/66 | HR 80 | Temp 98.0°F | Resp 18 | Ht 72.0 in

## 2012-07-09 DIAGNOSIS — F3289 Other specified depressive episodes: Secondary | ICD-10-CM

## 2012-07-09 DIAGNOSIS — F329 Major depressive disorder, single episode, unspecified: Secondary | ICD-10-CM

## 2012-07-09 DIAGNOSIS — F068 Other specified mental disorders due to known physiological condition: Secondary | ICD-10-CM

## 2012-07-09 DIAGNOSIS — J069 Acute upper respiratory infection, unspecified: Secondary | ICD-10-CM

## 2012-07-09 DIAGNOSIS — I10 Essential (primary) hypertension: Secondary | ICD-10-CM

## 2012-07-09 DIAGNOSIS — F0280 Dementia in other diseases classified elsewhere without behavioral disturbance: Secondary | ICD-10-CM

## 2012-07-09 MED ORDER — CITALOPRAM HYDROBROMIDE 20 MG PO TABS: 20.0000 mg | ORAL_TABLET | Freq: Every day | ORAL | Status: DC

## 2012-07-09 MED ORDER — GUAIFENESIN ER 600 MG PO TB12: 1200.0000 mg | ORAL_TABLET | Freq: Two times a day (BID) | ORAL | Status: DC

## 2012-07-09 NOTE — Patient Instructions (Addendum)
Increase the Celexa to 20 mg daily a prescription was sent in to the [place] pharmacy After one month the Celexa should be increased to 40 Continue with physical therapy encourage patient to ambulate daily at least a length of 1 hall Place patient on Mucinex 600 mg twice daily as a chronic medication

## 2012-07-09 NOTE — Progress Notes (Signed)
Subjective:    Patient ID: Timothy [name], male    DOB: 1924/05/11, 79 y.o.   MRN: [id number]  HPI Decreased conversation and ambulation Increased somnolence and inactivity alzheimer's has worsened ( 1972 year, 0/3 short term recall and can reverse words) Flat affect treatment in progress for pnemonia PT in progress   Review of Systems  Constitutional: Positive for activity change, appetite change and fatigue.  HENT: Positive for neck stiffness.   Eyes: Negative.   Respiratory: Positive for shortness of breath.   Cardiovascular: Positive for leg swelling.  Musculoskeletal: Positive for back pain, joint swelling and gait problem.       Past Medical History  Diagnosis Date  . Hypertension   . Arthritis   . BPH (benign prostatic hypertrophy)   . Carpal tunnel syndrome   . Hyperlipidemia   . Stroke   . Diverticulosis   . Dementia   . Complication of anesthesia     unknown    History   Social History  . Marital Status: Married    Spouse Name: N/A    Number of Children: N/A  . Years of Education: N/A   Occupational History  . Not on file.   Social History Main Topics  . Smoking status: Never Smoker   . Smokeless tobacco: Never Used  . Alcohol Use: No  . Drug Use: No  . Sexually Active: No   Other Topics Concern  . Not on file   Social History Narrative  . No narrative on file    Past Surgical History  Procedure Laterality Date  . Appendectomy    . Carpal tunnel release    . Hip replacement-left    . Joint replacement    . Rt knee replacement      Family History  Problem Relation Age of Onset  . Stroke Mother   . Heart disease Mother   . Diabetes Mother     No Known Allergies  Current Outpatient Prescriptions on File Prior to Visit  Medication Sig Dispense Refill  . carvedilol (COREG) 3.125 MG tablet Take 1 tablet (3.125 mg total) by mouth 2 (two) times daily with a meal.  60 tablet  1  . cefdinir (OMNICEF) 300 MG capsule Take 1 capsule  (300 mg total) by mouth 2 (two) times daily.  28 capsule  0  . colchicine 0.6 MG tablet Take 1 tablet (0.6 mg total) by mouth 2 (two) times daily.  60 tablet  6  . donepezil (ARICEPT) 10 MG tablet Take 10 mg by mouth at bedtime.       Marland Kitchen lisinopril (PRINIVIL,ZESTRIL) 2.5 MG tablet Take 1 tablet (2.5 mg total) by mouth daily.  30 tablet  0  . Memantine HCl ER (NAMENDA XR) 28 MG CP24 Take 28 mg by mouth daily.  30 capsule  11  . Rivaroxaban (XARELTO) 20 MG TABS Take 1 tablet (20 mg total) by mouth daily.  30 tablet  1   No current facility-administered medications on file prior to visit.    BP 130/66  Pulse 80  Temp(Src) 98 F (36.7 C)  Resp 18  Ht 6' (1.829 m)    Objective:   Physical Exam  Nursing note and vitals reviewed. Constitutional: He appears well-developed and well-nourished.  HENT:  Head: Normocephalic and atraumatic.  Eyes: Conjunctivae are normal. Pupils are equal, round, and reactive to light.  Neck: Normal range of motion. Neck supple.  Cardiovascular: Normal rate and regular rhythm.   Murmur heard. Pulmonary/Chest: Effort  normal.  Slight decreased breath sounds  Abdominal: Soft. Bowel sounds are normal.  Neurological: He is alert.  1972, failed clock passed reversal of words  Skin: Skin is warm and dry.          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

## 2012-07-12 ENCOUNTER — Telehealth: Payer: Self-pay | Admitting: Internal Medicine

## 2012-07-12 NOTE — Telephone Encounter (Signed)
Take bid for 30 days- then go back to to prn after 30 days--bukky informed

## 2012-07-12 NOTE — Telephone Encounter (Signed)
Nurse called regarding mucinex. She's wondering if Dr. Lovell Sheehan wanted this to be PRN, or for it to be on the PT's scheduled medications. She stated that it used to be PRN, but Friday, and order was faxed for it to be scheduled. Please clarify.

## 2012-07-14 ENCOUNTER — Telehealth: Payer: Self-pay | Admitting: Internal Medicine

## 2012-07-14 NOTE — Telephone Encounter (Signed)
done

## 2012-07-14 NOTE — Telephone Encounter (Signed)
Ms [facility] resident care coordinator from [place] in call concerning mucinex. Please verify stop date for medication and fax order to [phone]

## 2012-07-21 ENCOUNTER — Encounter: Payer: Self-pay | Admitting: Internal Medicine

## 2012-09-23 ENCOUNTER — Ambulatory Visit (INDEPENDENT_AMBULATORY_CARE_PROVIDER_SITE_OTHER): Payer: Medicare Other | Admitting: Neurology

## 2012-09-23 ENCOUNTER — Encounter: Payer: Self-pay | Admitting: Neurology

## 2012-09-23 VITALS — BP 105/62 | HR 67 | Temp 98.1°F | Ht 71.0 in | Wt 207.0 lb

## 2012-09-23 DIAGNOSIS — M21372 Foot drop, left foot: Secondary | ICD-10-CM

## 2012-09-23 DIAGNOSIS — G609 Hereditary and idiopathic neuropathy, unspecified: Secondary | ICD-10-CM

## 2012-09-23 DIAGNOSIS — I2782 Chronic pulmonary embolism: Secondary | ICD-10-CM

## 2012-09-23 DIAGNOSIS — M216X9 Other acquired deformities of unspecified foot: Secondary | ICD-10-CM

## 2012-09-23 DIAGNOSIS — F0390 Unspecified dementia without behavioral disturbance: Secondary | ICD-10-CM

## 2012-09-23 NOTE — Patient Instructions (Addendum)
I do want to suggest a few things today:  Engage in social activities in your community and with your family and try to keep up with current events by reading the newspaper or watching the news.   As far as your medications are concerned, I would like to suggest    As far as diagnostic testing: referral to cardiology, please call us back for the cardiologist's name, you would like to see. I will make a referral at the time.  I would like to see you back in 6 months, sooner if we need to. Please call us with any interim questions, concerns, problems, updates or refill requests.  Brett Canales is my clinical assistant and will answer any of your questions and relay your messages to me and also relay most of my messages to you.  Our phone number is [phone]. We also have an after hours call service for urgent matters and there is a physician on-call for urgent questions. For any emergencies you know to call 911 or go to the nearest emergency room.

## 2012-09-23 NOTE — Progress Notes (Signed)
Subjective:    Patient ID: Timothy [name] is a 79 y.o. male.  HPI  Interim history:   Timothy [name] is a very pleasant 79 year old right-handed gentleman who presents for followup consultation of his memory loss. He is accompanied by his wife today. This is his first visit after Dr. Imagene Gurney retirement and he was last seen by Dr. Fayrene Fearing love on 03/23/2012, and which time Dr. Sandria Manly felt that he was stable. He felt that he had moderate dementia. He has an underlying medical history of lacunar stroke in 1988, hypertension, peripheral neuropathy, history of left foot drop and gout. He is status post left hip replacement and right knee replacement surgeries as well as right carpal tunnel surgery and right ulnar decompression surgery as well. He is currently on lisinopril, Namenda, colchicine, donepezil, Celexa. I reviewed Dr. Imagene Gurney prior notes and the patient's records and below is a summary of that review: 79 year old gentleman who started having memory loss in 2001. He was first diagnosed with mild cognitive impairment with relative stability for years. In July 2008 his MMSE was 29, clock drawing was 4, animal fluency was 25. Brain MRI with and without contrast on November 2001 showed mild atrophy and scattered areas of infarction involving the right pons and thalamic region. Repeat MRI brain from March 2007 showed no acute lesions and old appearing right dorsolateral pons lesion extending into the superior cerebellar peduncle representing a lacunar infarct. He also had small bilateral thalamic infarcts in the deep left cerebellar infarct. Vitamin B12 and TSH have been normal. His MMSE scores were in the 28 and 29 range. His memory became worse in 2011 and he started becoming more confused and got lost driving. He was on Namenda 10 mg twice daily and Aricept 23 mg strength. MRI brain in February 2011 showed negative findings for any acute process but old stroke in the dorsal portion of the pons and bilateral  white matter disease that was unchanged from 2007. He has developed bladder and bowel incontinence but denies hallucinations. He does have anxiety and poor gait and balance. He uses a walker and a left foot brace. He has fallen. In January 2013 his MMSE was 20, clock drawing was 4, animal fluency was 14. He resides in [facility] assisted [facility]. In June 2013 he fell and struck his head. In February 2014 his MMSE was 18, clock drawing was 3, animal fluency was 19. Falls assessment total score was 22. He was recently hospitalized in May from 5/2 to 5/6 d/t a progressive cough and SOB and was taken [facility] and was found to have a PE and found no DVT: he was initially started on lovenox and coumadin and as his spouse did not want him to be on coumadin, he was changed to xarelto. Venous duplex was negative for DVT. Echo showed LVEF 40%, with inferior and posterior hypokinesis. No right heart strain evident on the echocardiogram. There was an incidental finding of an abdominal aneurysm. Outpatient follow up with a cardiologist was recommended. She has not taken him to a cardiologist, as she was told, that he did not need to go per PCP. He now sees [facility]. He was treated for CAP (community acquired pneumonia), and started levaquin 750 mg IV daily, and was discharged on 2 more days of levaquin to complete the course.   He has a remote Hx of heavy alcohol use, but not in 15 years.   His Past Medical History Is Significant For: Past Medical History  Diagnosis  Date  . Hypertension   . Arthritis   . BPH (benign prostatic hypertrophy)   . Carpal tunnel syndrome   . Hyperlipidemia   . Stroke   . Diverticulosis   . Dementia   . Complication of anesthesia     unknown  . Alzheimer's disease   . Edema     leg  . Osteoarthrosis, unspecified whether generalized or localized, unspecified site   . Other acquired deformity of ankle and foot(736.79)   . Memory loss   . Abnormality of gait   . Cervical  spondylosis without myelopathy   . Brachial neuritis or radiculitis NOS   . Gout, unspecified     His Past Surgical History Is Significant For: Past Surgical History  Procedure Laterality Date  . Appendectomy    . Carpal tunnel release    . Hip replacement-left    . Joint replacement    . Rt knee replacement      His Family History Is Significant For: Family History  Problem Relation Age of Onset  . Stroke Mother   . Heart disease Mother   . Diabetes Mother     His Social History Is Significant For: History   Social History  . Marital Status: Married    Spouse Name: N/A    Number of Children: N/A  . Years of Education: N/A   Social History Main Topics  . Smoking status: Never Smoker   . Smokeless tobacco: Never Used  . Alcohol Use: No  . Drug Use: No  . Sexual Activity: No   Other Topics Concern  . None   Social History Narrative  . None    His Allergies Are:  No Known Allergies:   His Current Medications Are:  Outpatient Encounter Prescriptions as of 09/23/2012  Medication Sig Dispense Refill  . citalopram (CELEXA) 20 MG tablet Take 1 tablet (20 mg total) by mouth daily.  30 tablet  6  . colchicine 0.6 MG tablet Take 1 tablet (0.6 mg total) by mouth 2 (two) times daily.  60 tablet  6  . donepezil (ARICEPT) 10 MG tablet Take 10 mg by mouth at bedtime.       Marland Kitchen guaiFENesin (MUCINEX) 600 MG 12 hr tablet Take 2 tablets (1,200 mg total) by mouth 2 (two) times daily.  60 tablet  11  . Memantine HCl ER (NAMENDA XR) 28 MG CP24 Take 28 mg by mouth daily.  30 capsule  11  . Rivaroxaban (XARELTO) 20 MG TABS Take 1 tablet (20 mg total) by mouth daily.  30 tablet  1  . carvedilol (COREG) 3.125 MG tablet Take 1 tablet (3.125 mg total) by mouth 2 (two) times daily with a meal.  60 tablet  1  . cefdinir (OMNICEF) 300 MG capsule Take 1 capsule (300 mg total) by mouth 2 (two) times daily.  28 capsule  0  . lisinopril (PRINIVIL,ZESTRIL) 2.5 MG tablet Take 1 tablet (2.5 mg  total) by mouth daily.  30 tablet  0   No facility-administered encounter medications on file as of 09/23/2012.   Review of Systems  Constitutional: Positive for activity change and fatigue.  HENT: Positive for hearing loss.   Gastrointestinal:       Incontinence  Genitourinary:       Incontinence  Neurological:       Memory loss  Psychiatric/Behavioral: Positive for confusion and dysphoric mood. The patient is nervous/anxious.        Sleepiness    Objective:  Neurologic  Exam  Physical Exam Physical Examination:   Filed Vitals:   09/23/12 1058  BP: 105/62  Pulse: 67  Temp: 98.1 F (36.7 C)    General Examination: The patient is a very pleasant 79 y.o. male in no acute distress. He is calm and cooperative with the exam. He denies Auditory Hallucinations and Visual Hallucinations. He is well groomed and situated in a wheelchair with AFO on L and compression socks.   HEENT: Normocephalic, atraumatic, pupils are equal, round and reactive to light and accommodation. Extraocular tracking shows mild saccadic breakdown without nystagmus noted. Hearing is impaired. Face is symmetric with no facial masking and normal facial sensation. There is no lip, neck or jaw tremor. There are no carotid bruits on auscultation. Oropharynx exam reveals moderate mouth dryness. No significant airway crowding is noted. Mallampati is class II. Tongue protrudes centrally and palate elevates symmetrically.    Chest: is clear to auscultation without wheezing, rhonchi or crackles noted.  Heart: sounds are regular and normal without murmurs, rubs or gallops noted.   Abdomen: is soft, non-tender and non-distended with normal bowel sounds appreciated on auscultation.  Extremities: There is 2+ pitting edema in the distal lower extremities bilaterally.   Skin: is warm and dry with no trophic changes noted. Age-related changes are noted on the skin.   Musculoskeletal: exam reveals no obvious joint deformities,  tenderness or joint swelling or erythema.    Neurologically:  Mental status: The patient is awake and alert, paying fair  attention. He is unable to provide the history. His wife provides the entire history. He is oriented to: person and situation. His memory, attention, language and knowledge are significantly impaired.   Cranial nerves are as described above under HEENT exam. In addition, shoulder shrug is normal with equal shoulder height noted.  Motor exam: Normal bulk, and strength for age is noted. Tone is Not rigid with absence of cogwheeling in the extremities. There is overall mild bradykinesia. There is no drift or rebound. There is no tremor.   Romberg is not tested. Reflexes are 1+ in the upper extremities and absent in the lower extremities. Fine motor skills: Finger taps, hand movements, and rapid alternating patting are moderately impaired bilaterally. Foot taps and foot agility are moderately impaired bilaterally.   Cerebellar testing shows no dysmetria or intention tremor on finger to nose testing. Heel to shin is unremarkable. There is no truncal or gait ataxia.   Sensory exam is decreased to vibration sense and light touch in the lower extremities to the knees. I did not remove his brace nor did I take off his compression stockings however.  Gait, station and balance: He was not asked to stand or walk for me today.   Assessment and Plan:    In summary, Timothy [name] is a very pleasant 79 y.o.-year old male with a complex medical history who has advanced dementia. He is on both Namenda and Aricept. He recently had a PE. He has a history of lower extremities swelling. He has neuropathy. He has a history of left foot drop in his left foot is in a brace. He has been advised to followup with cardiology and I will go ahead and make that referral since he has not yet been seen. His wife wants to take him to a certain practice but does not recall the doctor's name and will call us  back. I had a long chat with the patient and his wife regarding advanced dementia and that he is  already on dual therapy. She understands that dementia is progressive. Unfortunately, there is probably not a whole lot I can do to help this nice gentleman. He is advised to continue with his current medications and his followup as scheduled. Again, will go ahead and make referral to cardiology after his wife calls [place] back. They were in agreement. She did not need any refills today. I will see him back routinely in a few months, sooner if the need arises. They are advised to call with any interim questions, concerns, problems or updates.

## 2012-09-29 ENCOUNTER — Telehealth: Payer: Self-pay | Admitting: Neurology

## 2012-10-01 NOTE — Telephone Encounter (Signed)
Patient left a message with the front office: cardiologist is Dietrich Pates.

## 2012-10-01 NOTE — Telephone Encounter (Signed)
I sent a copy of my office note from 09/23/2012 to his cardiologist, Dr. Tenny Craw

## 2012-12-11 ENCOUNTER — Emergency Department (HOSPITAL_COMMUNITY)
Admission: EM | Admit: 2012-12-11 | Discharge: 2012-12-11 | Disposition: A | Discharge status: Home / Self Care | Payer: Medicare Other | Attending: Emergency Medicine | Admitting: Emergency Medicine

## 2012-12-11 DIAGNOSIS — Z8673 Personal history of transient ischemic attack (TIA), and cerebral infarction without residual deficits: Secondary | ICD-10-CM | POA: Insufficient documentation

## 2012-12-11 DIAGNOSIS — S61509A Unspecified open wound of unspecified wrist, initial encounter: Secondary | ICD-10-CM | POA: Insufficient documentation

## 2012-12-11 DIAGNOSIS — F0280 Dementia in other diseases classified elsewhere without behavioral disturbance: Secondary | ICD-10-CM | POA: Insufficient documentation

## 2012-12-11 DIAGNOSIS — Z7901 Long term (current) use of anticoagulants: Secondary | ICD-10-CM | POA: Insufficient documentation

## 2012-12-11 DIAGNOSIS — Z8719 Personal history of other diseases of the digestive system: Secondary | ICD-10-CM | POA: Insufficient documentation

## 2012-12-11 DIAGNOSIS — IMO0002 Reserved for concepts with insufficient information to code with codable children: Secondary | ICD-10-CM

## 2012-12-11 DIAGNOSIS — W19XXXA Unspecified fall, initial encounter: Secondary | ICD-10-CM

## 2012-12-11 DIAGNOSIS — W010XXA Fall on same level from slipping, tripping and stumbling without subsequent striking against object, initial encounter: Secondary | ICD-10-CM | POA: Insufficient documentation

## 2012-12-11 DIAGNOSIS — S61409A Unspecified open wound of unspecified hand, initial encounter: Secondary | ICD-10-CM | POA: Insufficient documentation

## 2012-12-11 DIAGNOSIS — Z23 Encounter for immunization: Secondary | ICD-10-CM | POA: Insufficient documentation

## 2012-12-11 DIAGNOSIS — Z862 Personal history of diseases of the blood and blood-forming organs and certain disorders involving the immune mechanism: Secondary | ICD-10-CM | POA: Insufficient documentation

## 2012-12-11 DIAGNOSIS — Y9210 Unspecified residential institution as the place of occurrence of the external cause: Secondary | ICD-10-CM | POA: Insufficient documentation

## 2012-12-11 DIAGNOSIS — I10 Essential (primary) hypertension: Secondary | ICD-10-CM | POA: Insufficient documentation

## 2012-12-11 DIAGNOSIS — M109 Gout, unspecified: Secondary | ICD-10-CM | POA: Insufficient documentation

## 2012-12-11 DIAGNOSIS — G309 Alzheimer's disease, unspecified: Secondary | ICD-10-CM | POA: Insufficient documentation

## 2012-12-11 DIAGNOSIS — Z79899 Other long term (current) drug therapy: Secondary | ICD-10-CM | POA: Insufficient documentation

## 2012-12-11 DIAGNOSIS — Z8669 Personal history of other diseases of the nervous system and sense organs: Secondary | ICD-10-CM | POA: Insufficient documentation

## 2012-12-11 DIAGNOSIS — Y9389 Activity, other specified: Secondary | ICD-10-CM | POA: Insufficient documentation

## 2012-12-11 DIAGNOSIS — Z8639 Personal history of other endocrine, nutritional and metabolic disease: Secondary | ICD-10-CM | POA: Insufficient documentation

## 2012-12-11 DIAGNOSIS — Z872 Personal history of diseases of the skin and subcutaneous tissue: Secondary | ICD-10-CM | POA: Insufficient documentation

## 2012-12-11 MED ORDER — TETANUS-DIPHTH-ACELL PERTUSSIS 5-2.5-18.5 LF-MCG/0.5 IM SUSP
0.5000 mL | Freq: Once | INTRAMUSCULAR | Status: AC
  Administered 2012-12-11: 0.5 mL via INTRAMUSCULAR
  Filled 2012-12-11: qty 0.5

## 2012-12-11 NOTE — ED Provider Notes (Signed)
CSN: [id number]     Arrival date & time 12/11/12  1246 History   First MD Initiated Contact with Patient 12/11/12 1302     Chief Complaint  Patient presents with  . Fall   (Consider location/radiation/quality/duration/timing/severity/associated sxs/prior Treatment) The history is provided by the patient.  Timothy [name] is a 79 y.o. male hx of HTN, dementia, stroke here with fall. He was in the bathroom and suddenly slipped and fell on his hands. Denies any head injury or loss of consciousness or syncope. He didn't want to come for evaluation but was sent by the nursing home. He was there she has some skin tears on the right wrist and left hand. Tetanus unknown.    Level V caveat- dementia   Past Medical History  Diagnosis Date  . Hypertension   . Arthritis   . BPH (benign prostatic hypertrophy)   . Carpal tunnel syndrome   . Hyperlipidemia   . Stroke   . Diverticulosis   . Dementia   . Complication of anesthesia     unknown  . Alzheimer's disease   . Edema     leg  . Osteoarthrosis, unspecified whether generalized or localized, unspecified site   . Other acquired deformity of ankle and foot(736.79)   . Memory loss   . Abnormality of gait   . Cervical spondylosis without myelopathy   . Brachial neuritis or radiculitis NOS   . Gout, unspecified    Past Surgical History  Procedure Laterality Date  . Appendectomy    . Carpal tunnel release    . Hip replacement-left    . Joint replacement    . Rt knee replacement     Family History  Problem Relation Age of Onset  . Stroke Mother   . Heart disease Mother   . Diabetes Mother    History  Substance Use Topics  . Smoking status: Never Smoker   . Smokeless tobacco: Never Used  . Alcohol Use: No    Review of Systems  Skin: Positive for wound.  All other systems reviewed and are negative.    Allergies  Review of patient's allergies indicates no known allergies.  Home Medications   Current Outpatient Rx    Name  Route  Sig  Dispense  Refill  . carvedilol (COREG) 3.125 MG tablet   Oral   Take 1 tablet (3.125 mg total) by mouth 2 (two) times daily with a meal.   60 tablet   1   . cefdinir (OMNICEF) 300 MG capsule   Oral   Take 1 capsule (300 mg total) by mouth 2 (two) times daily.   28 capsule   0   . citalopram (CELEXA) 20 MG tablet   Oral   Take 1 tablet (20 mg total) by mouth daily.   30 tablet   6   . colchicine 0.6 MG tablet   Oral   Take 1 tablet (0.6 mg total) by mouth 2 (two) times daily.   60 tablet   6   . donepezil (ARICEPT) 10 MG tablet   Oral   Take 10 mg by mouth at bedtime.          Marland Kitchen guaiFENesin (MUCINEX) 600 MG 12 hr tablet   Oral   Take 2 tablets (1,200 mg total) by mouth 2 (two) times daily.   60 tablet   11   . lisinopril (PRINIVIL,ZESTRIL) 2.5 MG tablet   Oral   Take 1 tablet (2.5 mg total) by mouth daily.  30 tablet   0   . Memantine HCl ER (NAMENDA XR) 28 MG CP24   Oral   Take 28 mg by mouth daily.   30 capsule   11   . Rivaroxaban (XARELTO) 20 MG TABS   Oral   Take 1 tablet (20 mg total) by mouth daily.   30 tablet   1    BP 133/81  Pulse 61  Temp(Src) 97.7 F (36.5 C) (Oral)  Resp 16  SpO2 99% Physical Exam  Nursing note and vitals reviewed. Constitutional: He is oriented to person, place, and time.  Slightly demented, NAD   HENT:  Head: Normocephalic.  Mouth/Throat: Oropharynx is clear and moist.  Wound on scalp and L jaw with dressing (per patient this is chronic and not getting worse). No signs of head injury   Eyes: Conjunctivae are normal. Pupils are equal, round, and reactive to light.  Neck: Normal range of motion. Neck supple.  Cardiovascular: Normal rate, regular rhythm and normal heart sounds.   Pulmonary/Chest: Effort normal and breath sounds normal. No respiratory distress. He has no wheezes. He has no rales.  Abdominal: Soft. Bowel sounds are normal. He exhibits no distension. There is no tenderness.   Musculoskeletal: Normal range of motion.       Hands: Small abrasion and skin tag on L 4th MCP and R wrist. Nl ROM of all digits and wrist. 2+ pulses, nl capillary refill. No midline spinal tenderness. NL bilateral hips   Neurological: He is alert and oriented to person, place, and time.  Skin: Skin is warm and dry.  Psychiatric: He has a normal mood and affect. His behavior is normal. Judgment and thought content normal.    ED Course  Procedures (including critical care time) Labs Review Labs Reviewed - No data to display Imaging Review No results found.  EKG Interpretation   None       MDM  No diagnosis found. [name] is a 79 y.o. male here with skin tear s/p fall. Not syncope. No evidence of fracture. Tetanus updated. Wound dressed by nursing and doesn't require stitches. Stable for d/c.     [name], MD 12/11/12 1346

## 2012-12-11 NOTE — ED Notes (Signed)
[facility] called for transport back to facility.  Has no complaints of pain

## 2012-12-11 NOTE — ED Notes (Signed)
Pt from [facility] at [facility].  Per EMS, unwitnessed fall while getting up from toilet and fastening pants, has small skin tear to back of Lt hand, RT wrist, no LOC, PERRLA.   Pt didn't want to come, but per SNF policy pt needed to come for eval.

## 2012-12-11 NOTE — ED Notes (Signed)
Bed: AO13 Expected date: 12/11/12 Expected time: 12:32 PM Means of arrival: Ambulance Comments: Fall eval no injury

## 2012-12-11 NOTE — Progress Notes (Signed)
Per protocol, CSW contacted [facility] at [facility] to confirm pt's residency and level of care.  Per Sharmon Leyden, pt is resident in their assisted living and he wears a wander guard.  [place] Spaniel Crosby, [phone]     ED CSW  1:47pm

## 2013-03-11 ENCOUNTER — Encounter (HOSPITAL_COMMUNITY): Payer: Self-pay | Admitting: Emergency Medicine

## 2013-03-11 ENCOUNTER — Emergency Department (HOSPITAL_COMMUNITY): Payer: Medicare Other

## 2013-03-11 ENCOUNTER — Emergency Department (HOSPITAL_COMMUNITY)
Admission: EM | Admit: 2013-03-11 | Discharge: 2013-03-11 | Disposition: A | Discharge status: Home / Self Care | Payer: Medicare Other | Attending: Emergency Medicine | Admitting: Emergency Medicine

## 2013-03-11 DIAGNOSIS — Y929 Unspecified place or not applicable: Secondary | ICD-10-CM | POA: Insufficient documentation

## 2013-03-11 DIAGNOSIS — Y939 Activity, unspecified: Secondary | ICD-10-CM | POA: Insufficient documentation

## 2013-03-11 DIAGNOSIS — Z792 Long term (current) use of antibiotics: Secondary | ICD-10-CM | POA: Insufficient documentation

## 2013-03-11 DIAGNOSIS — Z862 Personal history of diseases of the blood and blood-forming organs and certain disorders involving the immune mechanism: Secondary | ICD-10-CM | POA: Insufficient documentation

## 2013-03-11 DIAGNOSIS — M1990 Unspecified osteoarthritis, unspecified site: Secondary | ICD-10-CM | POA: Insufficient documentation

## 2013-03-11 DIAGNOSIS — T148XXA Other injury of unspecified body region, initial encounter: Secondary | ICD-10-CM

## 2013-03-11 DIAGNOSIS — Z8639 Personal history of other endocrine, nutritional and metabolic disease: Secondary | ICD-10-CM | POA: Insufficient documentation

## 2013-03-11 DIAGNOSIS — R609 Edema, unspecified: Secondary | ICD-10-CM | POA: Insufficient documentation

## 2013-03-11 DIAGNOSIS — M109 Gout, unspecified: Secondary | ICD-10-CM | POA: Insufficient documentation

## 2013-03-11 DIAGNOSIS — IMO0002 Reserved for concepts with insufficient information to code with codable children: Secondary | ICD-10-CM | POA: Insufficient documentation

## 2013-03-11 DIAGNOSIS — Z8719 Personal history of other diseases of the digestive system: Secondary | ICD-10-CM | POA: Insufficient documentation

## 2013-03-11 DIAGNOSIS — W19XXXA Unspecified fall, initial encounter: Secondary | ICD-10-CM | POA: Insufficient documentation

## 2013-03-11 DIAGNOSIS — Z79899 Other long term (current) drug therapy: Secondary | ICD-10-CM | POA: Insufficient documentation

## 2013-03-11 DIAGNOSIS — F0280 Dementia in other diseases classified elsewhere without behavioral disturbance: Secondary | ICD-10-CM | POA: Insufficient documentation

## 2013-03-11 DIAGNOSIS — G309 Alzheimer's disease, unspecified: Secondary | ICD-10-CM | POA: Insufficient documentation

## 2013-03-11 DIAGNOSIS — I10 Essential (primary) hypertension: Secondary | ICD-10-CM | POA: Insufficient documentation

## 2013-03-11 DIAGNOSIS — Z87448 Personal history of other diseases of urinary system: Secondary | ICD-10-CM | POA: Insufficient documentation

## 2013-03-11 DIAGNOSIS — Z8669 Personal history of other diseases of the nervous system and sense organs: Secondary | ICD-10-CM | POA: Insufficient documentation

## 2013-03-11 DIAGNOSIS — Z8673 Personal history of transient ischemic attack (TIA), and cerebral infarction without residual deficits: Secondary | ICD-10-CM | POA: Insufficient documentation

## 2013-03-11 LAB — URINALYSIS, ROUTINE W REFLEX MICROSCOPIC
BILIRUBIN URINE: NEGATIVE
Glucose, UA: NEGATIVE mg/dL
Hgb urine dipstick: NEGATIVE
Ketones, ur: NEGATIVE mg/dL
LEUKOCYTES UA: NEGATIVE
NITRITE: NEGATIVE
PH: 5 (ref 5.0–8.0)
Protein, ur: NEGATIVE mg/dL
SPECIFIC GRAVITY, URINE: 1.016 (ref 1.005–1.030)
UROBILINOGEN UA: 0.2 mg/dL (ref 0.0–1.0)

## 2013-03-11 LAB — CBC WITH DIFFERENTIAL/PLATELET
BASOS ABS: 0 10*3/uL (ref 0.0–0.1)
BASOS PCT: 0 % (ref 0–1)
EOS ABS: 0.1 10*3/uL (ref 0.0–0.7)
Eosinophils Relative: 1 % (ref 0–5)
HCT: 46.2 % (ref 39.0–52.0)
HEMOGLOBIN: 15.3 g/dL (ref 13.0–17.0)
Lymphocytes Relative: 26 % (ref 12–46)
Lymphs Abs: 3 10*3/uL (ref 0.7–4.0)
MCH: 30.5 pg (ref 26.0–34.0)
MCHC: 33.1 g/dL (ref 30.0–36.0)
MCV: 92 fL (ref 78.0–100.0)
MONOS PCT: 6 % (ref 3–12)
Monocytes Absolute: 0.7 10*3/uL (ref 0.1–1.0)
NEUTROS ABS: 7.4 10*3/uL (ref 1.7–7.7)
NEUTROS PCT: 66 % (ref 43–77)
PLATELETS: 141 10*3/uL — AB (ref 150–400)
RBC: 5.02 MIL/uL (ref 4.22–5.81)
RDW: 14.2 % (ref 11.5–15.5)
WBC: 11.3 10*3/uL — ABNORMAL HIGH (ref 4.0–10.5)

## 2013-03-11 LAB — BASIC METABOLIC PANEL
BUN: 36 mg/dL — AB (ref 6–23)
CHLORIDE: 106 meq/L (ref 96–112)
CO2: 22 mEq/L (ref 19–32)
Calcium: 9.5 mg/dL (ref 8.4–10.5)
Creatinine, Ser: 1.3 mg/dL (ref 0.50–1.35)
GFR, EST AFRICAN AMERICAN: 56 mL/min — AB (ref >90)
GFR, EST NON AFRICAN AMERICAN: 48 mL/min — AB (ref >90)
Glucose, Bld: 96 mg/dL (ref 70–99)
POTASSIUM: 4.9 meq/L (ref 3.7–5.3)
SODIUM: 142 meq/L (ref 137–147)

## 2013-03-11 NOTE — ED Notes (Signed)
Pt brief changed and peri care done.

## 2013-03-11 NOTE — ED Notes (Signed)
Per EMS, pt. From [facility] @ [facility] , reported of unwitnessed fall , pt. Was found lying on the floor and head against pt.'s bed at 6am today. Pt. Denies LOC , alert and oriented x2,pt. Is in Memory Unit. acquired laceration at the back of his head, reported of minimal bleeding with old scab next to the lac.  Checked for back spine and back injury , none reported.

## 2013-03-11 NOTE — ED Provider Notes (Signed)
CSN: [id number]     Arrival date & time 03/11/13  0865 History   First MD Initiated Contact with Patient 03/11/13 [phone]     Chief Complaint  Patient presents with  . Fall  . Head Injury     (Consider location/radiation/quality/duration/timing/severity/associated sxs/prior Treatment) Patient is a 80 y.o. male presenting with fall and head injury. The history is provided by the patient and the EMS personnel.  Fall This is a recurrent problem. The current episode started 3 to 5 hours ago. Episode frequency: once. The problem has been resolved. Pertinent negatives include no chest pain, no abdominal pain, no headaches and no shortness of breath. Nothing aggravates the symptoms. Nothing relieves the symptoms. He has tried nothing for the symptoms. The treatment provided significant relief.  Head Injury Location:  Occipital Mechanism of injury: fall   Pain details:    Severity:  No pain Associated symptoms: no headaches, no nausea, no neck pain, no numbness and no vomiting     Past Medical History  Diagnosis Date  . Hypertension   . Arthritis   . BPH (benign prostatic hypertrophy)   . Carpal tunnel syndrome   . Hyperlipidemia   . Stroke   . Diverticulosis   . Dementia   . Complication of anesthesia     unknown  . Alzheimer's disease   . Edema     leg  . Osteoarthrosis, unspecified whether generalized or localized, unspecified site   . Other acquired deformity of ankle and foot(736.79)   . Memory loss   . Abnormality of gait   . Cervical spondylosis without myelopathy   . Brachial neuritis or radiculitis NOS   . Gout, unspecified    Past Surgical History  Procedure Laterality Date  . Appendectomy    . Carpal tunnel release    . Hip replacement-left    . Joint replacement    . Rt knee replacement     Family History  Problem Relation Age of Onset  . Stroke Mother   . Heart disease Mother   . Diabetes Mother    History  Substance Use Topics  . Smoking status: Never  Smoker   . Smokeless tobacco: Never Used  . Alcohol Use: No    Review of Systems  Constitutional: Negative for fever.  HENT: Negative for drooling and rhinorrhea.   Eyes: Negative for pain.  Respiratory: Positive for cough (coughing on exam). Negative for shortness of breath.   Cardiovascular: Negative for chest pain and leg swelling.  Gastrointestinal: Negative for nausea, vomiting, abdominal pain and diarrhea.  Genitourinary: Negative for dysuria and hematuria.  Musculoskeletal: Negative for gait problem and neck pain.  Skin: Negative for color change.  Neurological: Negative for numbness and headaches.  Hematological: Negative for adenopathy.  Psychiatric/Behavioral: Negative for behavioral problems.  All other systems reviewed and are negative.      Allergies  Review of patient's allergies indicates no known allergies.  Home Medications   Current Outpatient Rx  Name  Route  Sig  Dispense  Refill  . carvedilol (COREG) 3.125 MG tablet   Oral   Take 1 tablet (3.125 mg total) by mouth 2 (two) times daily with a meal.   60 tablet   1   . cefdinir (OMNICEF) 300 MG capsule   Oral   Take 1 capsule (300 mg total) by mouth 2 (two) times daily.   28 capsule   0   . citalopram (CELEXA) 20 MG tablet   Oral   Take 1  tablet (20 mg total) by mouth daily.   30 tablet   6   . colchicine 0.6 MG tablet   Oral   Take 1 tablet (0.6 mg total) by mouth 2 (two) times daily.   60 tablet   6   . donepezil (ARICEPT) 10 MG tablet   Oral   Take 10 mg by mouth at bedtime.          Marland Kitchen guaiFENesin (MUCINEX) 600 MG 12 hr tablet   Oral   Take 2 tablets (1,200 mg total) by mouth 2 (two) times daily.   60 tablet   11   . lisinopril (PRINIVIL,ZESTRIL) 2.5 MG tablet   Oral   Take 1 tablet (2.5 mg total) by mouth daily.   30 tablet   0   . Memantine HCl ER (NAMENDA XR) 28 MG CP24   Oral   Take 28 mg by mouth daily.   30 capsule   11   . Rivaroxaban (XARELTO) 20 MG TABS    Oral   Take 1 tablet (20 mg total) by mouth daily.   30 tablet   1    BP 159/96  Pulse 77  Temp(Src) 97.7 F (36.5 C) (Oral)  Resp 18  SpO2 100% Physical Exam  Nursing note and vitals reviewed. Constitutional: He appears well-developed and well-nourished.  Interactive. Jovial on exam.   HENT:  Head: Normocephalic and atraumatic.  Right Ear: External ear normal.  Left Ear: External ear normal.  Nose: Nose normal.  Mouth/Throat: Oropharynx is clear and moist. No oropharyngeal exudate.  Multiple abrasions to the posterior occipital area and the vertex of the head.  Tympanic membranes clear bilaterally.  Eyes: Conjunctivae and EOM are normal. Pupils are equal, round, and reactive to light.  Neck: Normal range of motion. Neck supple.  No cervical or other vertebral tenderness to palpation.  Cardiovascular: Normal rate, regular rhythm, normal heart sounds and intact distal pulses.  Exam reveals no gallop and no friction rub.   No murmur heard. Pulmonary/Chest: Effort normal and breath sounds normal. No respiratory distress. He has no wheezes.  Abdominal: Soft. Bowel sounds are normal. He exhibits no distension. There is no tenderness. There is no rebound and no guarding.  Musculoskeletal: Normal range of motion. He exhibits edema (mild pitting edema in bilateral lower distal extremities, slightly worse on the left). He exhibits no tenderness.  Normal range of motion of bilateral hips without tenderness to palpation.  Neurological: He is alert. He has normal strength. No cranial nerve deficit or sensory deficit. Coordination normal.  A/o x 2. (doesn't know year)  Normal finger to nose bilaterally.   Skin: Skin is warm and dry.  Psychiatric: He has a normal mood and affect. His behavior is normal.    ED Course  Procedures (including critical care time) Labs Review Labs Reviewed  CBC WITH DIFFERENTIAL - Abnormal; Notable for the following:    WBC 11.3 (*)    Platelets 141 (*)     All other components within normal limits  BASIC METABOLIC PANEL - Abnormal; Notable for the following:    BUN 36 (*)    GFR calc non Af Amer 48 (*)    GFR calc Af Amer 56 (*)    All other components within normal limits  URINALYSIS, ROUTINE W REFLEX MICROSCOPIC   Imaging Review Dg Chest 2 View  03/11/2013   CLINICAL DATA:  Laceration, fall  EXAM: CHEST  2 VIEW  COMPARISON:  05/28/2012  FINDINGS: Mild enlargement  of cardiac silhouette.  Calcified tortuous aorta.  Pulmonary vascularity normal.  Lungs clear.  No pleural effusion or pneumothorax.  Scattered endplate spur formation thoracic spine.  Osseous demineralization.  IMPRESSION: No acute abnormalities.  Enlargement of cardiac silhouette.   Electronically Signed   By: Ulyses SouthwardMark  Boles M.D.   On: 03/11/2013 07:49   Dg Pelvis 1-2 Views  03/11/2013   CLINICAL DATA:  Bilateral hip pain, fell this morning  EXAM: PELVIS - 1-2 VIEW  COMPARISON:  07/22/2011  FINDINGS: Acetabular and femoral components of a left hip prosthesis again identified in expected positions.  Diffuse osseous demineralization.  Degenerative changes right hip joint with joint space narrowing.  SI joints symmetric.  Mild degenerative disc disease changes at visualized portion of lower lumbar spine.  No acute fracture, dislocation, or bone destruction.  IMPRESSION: Osseous demineralization with postsurgical changes of left hip replacement an note of degenerative changes of the right hip joint.  No acute abnormalities.   Electronically Signed   By: Ulyses SouthwardMark  Boles M.D.   On: 03/11/2013 07:48   Ct Head Wo Contrast  03/11/2013   CLINICAL DATA:  Larey SeatFell and hit head  EXAM: CT HEAD WITHOUT CONTRAST  CT NECK WITHOUT CONTRAST  TECHNIQUE: Contiguous axial images were obtained from the base of the skull through the vertex without contrast. Multidetector CT imaging of the neck was performed using the standard protocol without intravenous contrast.  COMPARISON:  CT HEAD W/O CM dated 04/02/2012  FINDINGS: CT  HEAD FINDINGS  Significant atrophy. Mild low attenuation in the deep white matter. No infarct extra-axial fluid or hemorrhage. No skull fracture. Left occipital scalp laceration noted. No change from prior study.  CT NECK FINDINGS  There is heavy carotid arterial calcification. There is normal anterior-posterior alignment with no prevertebral soft tissue swelling. There is no fracture identified. There is severe degenerative disc disease throughout the entire cervical spine as well as severe degenerative facet change.  IMPRESSION: 1. Chronic involutional change intracranially. No acute intracranial abnormality. 2. Severe degenerative change throughout the cervical spine. No acute traumatic cervical spine abnormality.   Electronically Signed   By: Esperanza Heiraymond  Rubner M.D.   On: 03/11/2013 08:12   Ct Cervical Spine Wo Contrast  03/11/2013   CLINICAL DATA:  Larey SeatFell and hit head  EXAM: CT HEAD WITHOUT CONTRAST  CT NECK WITHOUT CONTRAST  TECHNIQUE: Contiguous axial images were obtained from the base of the skull through the vertex without contrast. Multidetector CT imaging of the neck was performed using the standard protocol without intravenous contrast.  COMPARISON:  CT HEAD W/O CM dated 04/02/2012  FINDINGS: CT HEAD FINDINGS  Significant atrophy. Mild low attenuation in the deep white matter. No infarct extra-axial fluid or hemorrhage. No skull fracture. Left occipital scalp laceration noted. No change from prior study.  CT NECK FINDINGS  There is heavy carotid arterial calcification. There is normal anterior-posterior alignment with no prevertebral soft tissue swelling. There is no fracture identified. There is severe degenerative disc disease throughout the entire cervical spine as well as severe degenerative facet change.  IMPRESSION: 1. Chronic involutional change intracranially. No acute intracranial abnormality. 2. Severe degenerative change throughout the cervical spine. No acute traumatic cervical spine abnormality.    Electronically Signed   By: Esperanza Heiraymond  Rubner M.D.   On: 03/11/2013 08:12    EKG Interpretation    Date/Time:  Friday March 11 2013 07:27:12 EST Ventricular Rate:  76 PR Interval:  188 QRS Duration: 77 QT Interval:  406 QTC Calculation: 456  R Axis:   8 Text Interpretation:  Normal sinus rhythm No significant change since last tracing Confirmed by Amore Grater  MD, Kalianne Fetting ([id number]) on 03/11/2013 7:31:17 AM            MDM   Final diagnoses:  Fall  Abrasion    7:12 AM 80 y.o. male w hx of CVA, alzheimers dementia, on xarelto who pw unwitnessed fall which occurred sometime during the night. The patient was found outside of his bed on the ground with the occiput of his head against the bed. He reports that he had a mechanical fall in the night but cannot describe the fall. He is alert and oriented x2 (doesn't know year) and has no complaints currently on exam. He has multiple abrasions to the occipital area of his head. He is afebrile and vital signs are unremarkable here. All likely mechanical but will get screening labs and imaging.  8:15 AM Repeat eval, pt has no complaints. Abrasions cleaned w/ hydrogen peroxide.   Check w/ pharmacy. Tdap updated on 12/11/12.   9:32 AM: Pt continues to appear well. Labs/imaging/ecg non-contrib. Wife here at bedside. I notified her of the findings. She notes that he has limited mobility and is able to ambulate w/ a walker w/ increased effort.  I have discussed the diagnosis/risks/treatment options with the patient/wife and believe the pt to be eligible for discharge home to follow-up with pcp as needed. We also discussed returning to the ED immediately if new or worsening sx occur. We discussed the sx which are most concerning (e.g., HA, fever, new pain) that necessitate immediate return. Medications administered to the patient during their visit and any new prescriptions provided to the patient are listed below.  Medications given during this  visit Medications - No data to display  New Prescriptions   No medications on file     Junius Argyle, MD 03/11/13 1022

## 2013-03-11 NOTE — Discharge Instructions (Signed)
Abrasion °An abrasion is a cut or scrape of the skin. Abrasions do not extend through all layers of the skin and most heal within 10 days. It is important to care for your abrasion properly to prevent infection. °CAUSES  °Most abrasions are caused by falling on, or gliding across, the ground or other surface. When your skin rubs on something, the outer and inner layer of skin rubs off, causing an abrasion. °DIAGNOSIS  °Your caregiver will be able to diagnose an abrasion during a physical exam.  °TREATMENT  °Your treatment depends on how large and deep the abrasion is. Generally, your abrasion will be cleaned with water and a mild soap to remove any dirt or debris. An antibiotic ointment may be put over the abrasion to prevent an infection. A bandage (dressing) may be wrapped around the abrasion to keep it from getting dirty.  °You may need a tetanus shot if: °· You cannot remember when you had your last tetanus shot. °· You have never had a tetanus shot. °· The injury broke your skin. °If you get a tetanus shot, your arm may swell, get red, and feel warm to the touch. This is common and not a problem. If you need a tetanus shot and you choose not to have one, there is a rare chance of getting tetanus. Sickness from tetanus can be serious.  °HOME CARE INSTRUCTIONS  °· If a dressing was applied, change it at least once a day or as directed by your caregiver. If the bandage sticks, soak it off with warm water.   °· Wash the area with water and a mild soap to remove all the ointment 2 times a day. Rinse off the soap and pat the area dry with a clean towel.   °· Reapply any ointment as directed by your caregiver. This will help prevent infection and keep the bandage from sticking. Use gauze over the wound and under the dressing to help keep the bandage from sticking.   °· Change your dressing right away if it becomes wet or dirty.   °· Only take over-the-counter or prescription medicines for pain, discomfort, or fever as  directed by your caregiver.   °· Follow up with your caregiver within 24 48 hours for a wound check, or as directed. If you were not given a wound-check appointment, look closely at your abrasion for redness, swelling, or pus. These are signs of infection. °SEEK IMMEDIATE MEDICAL CARE IF:  °· You have increasing pain in the wound.   °· You have redness, swelling, or tenderness around the wound.   °· You have pus coming from the wound.   °· You have a fever or persistent symptoms for more than 2 3 days. °· You have a fever and your symptoms suddenly get worse. °· You have a bad smell coming from the wound or dressing.   °MAKE SURE YOU:  °· Understand these instructions. °· Will watch your condition. °· Will get help right away if you are not doing well or get worse. °Document Released: 10/23/2004 Document Revised: 12/31/2011 Document Reviewed: 12/17/2010 °[employer]® Patient Information ©2014 [employer]. ° °

## 2013-03-11 NOTE — ED Notes (Signed)
Pt transported to CT/xray 

## 2013-03-11 NOTE — ED Notes (Signed)
Bed: HL45 Expected date:  Expected time:  Means of arrival:  Comments: EMS 80yo M, fall

## 2013-03-11 NOTE — ED Notes (Signed)
[facility] called to transport pt to [place] @ [facility].

## 2013-03-11 NOTE — ED Notes (Signed)
MD at bedside. 

## 2013-03-22 ENCOUNTER — Ambulatory Visit: Payer: Medicare Other | Admitting: Neurology

## 2013-08-09 ENCOUNTER — Encounter: Payer: Self-pay | Admitting: Neurology

## 2013-08-09 ENCOUNTER — Ambulatory Visit (INDEPENDENT_AMBULATORY_CARE_PROVIDER_SITE_OTHER): Payer: Medicare Other | Admitting: Neurology

## 2013-08-09 ENCOUNTER — Encounter (INDEPENDENT_AMBULATORY_CARE_PROVIDER_SITE_OTHER): Payer: Self-pay

## 2013-08-09 VITALS — BP 101/66 | HR 62 | Temp 97.2°F | Ht 71.0 in

## 2013-08-09 DIAGNOSIS — Z9181 History of falling: Secondary | ICD-10-CM

## 2013-08-09 DIAGNOSIS — M21372 Foot drop, left foot: Secondary | ICD-10-CM

## 2013-08-09 DIAGNOSIS — R296 Repeated falls: Secondary | ICD-10-CM

## 2013-08-09 DIAGNOSIS — Z86711 Personal history of pulmonary embolism: Secondary | ICD-10-CM

## 2013-08-09 DIAGNOSIS — R600 Localized edema: Secondary | ICD-10-CM

## 2013-08-09 DIAGNOSIS — F0390 Unspecified dementia without behavioral disturbance: Secondary | ICD-10-CM

## 2013-08-09 DIAGNOSIS — G609 Hereditary and idiopathic neuropathy, unspecified: Secondary | ICD-10-CM

## 2013-08-09 DIAGNOSIS — R609 Edema, unspecified: Secondary | ICD-10-CM

## 2013-08-09 DIAGNOSIS — M216X9 Other acquired deformities of unspecified foot: Secondary | ICD-10-CM

## 2013-08-09 DIAGNOSIS — I509 Heart failure, unspecified: Secondary | ICD-10-CM

## 2013-08-09 NOTE — Patient Instructions (Addendum)
We will refer you to cardiology, Dr. Jacinto Halim I will continue your dementia medications, Aricept generic and Namenda XR at the current doses.  Use your walker at all times, you are at severe fall risk. Do not try to walk without the walker and if you feel unsteady, call for help! Drink more water!

## 2013-08-09 NOTE — Progress Notes (Signed)
Subjective:    Patient ID: Timothy [name] is a 80 y.o. male.  HPI    Interim history:   Mr. Timothy [name] is a very pleasant 80 year old right-handed gentleman with an underlying medical history of lacunar stroke in 1988, hypertension, peripheral neuropathy, left foot drop, PE, gout, s/p left and right hip replacement surgeries, right carpal tunnel surgery and right ulnar decompression surgery, who presents for followup consultation of his advanced dementia. He is accompanied by his wife again today. I first met him on 09/23/2012, at which time he had recently been diagnosed with a PE. He had lower extremity swelling. I made a referral to cardiology and his wife called back in 9/14 specifically with the name of the cardiologist that they wanted to see, Dr. Dorris Carnes. I did not make any medication changes. In the interim, he was seen in the emergency room 2 times, each time after a fall. On 12/11/2012 he went to the emergency room after a fall in the bathroom and he landed on his hands. There is no loss of consciousness or head injury and he needed attention to his hand injury but no stitches. On 03/11/2013 he went to the emergency room via EMS. He had hit his head after a fall. Apparently he fell out of his bed and hit the occiput. Workup in the emergency room included chest x-ray, which showed no acute abnormalities and enlargement of cardiac silhouette, pelvic x-ray showed degenerative changes of the right hip joint and no acute abnormalities and per surgical changes of the left hip replacement. Head CT without contrast showed chronic involutional changes intracranially, no acute intracranial abnormality. CT cervical spine showed severe degenerative change throughout the cervical spine, no acute traumatic cervical spine abnormality. EKG showed no acute changes.  Today, his wife reports, that his walking is very limited and while he has not fallen while using his 2 wheeled walker, walking is a very tedious  task and he requires maximum assistance to help him to the walker. He did not end up seeing Dr. Dorris Carnes for unclear reasons.   He previously followed with Dr. Morene Antu and was last seen by Dr. Erling Cruz on 03/23/2012, and which time Dr. Erling Cruz felt that he was stable. He felt that he had moderate dementia. He has been on lisinopril, Namenda, colchicine, donepezil, Celexa.  He started having memory loss in 2001. He was first diagnosed with mild cognitive impairment with relative stability for years. In July 2008 his MMSE was 29, clock drawing was 4, animal fluency was 25. Brain MRI with and without contrast on November 2001 showed mild atrophy and scattered areas of infarction involving the right pons and thalamic region. Repeat MRI brain from March 2007 showed no acute lesions and old appearing right dorsolateral pons lesion extending into the superior cerebellar peduncle representing a lacunar infarct. He also had small bilateral thalamic infarcts in the deep left cerebellar infarct. Vitamin B12 and TSH have been normal. His MMSE scores were in the 28 and 29 range. His memory became worse in 2011 and he started becoming more confused and got lost driving. He was on Namenda 10 mg twice daily and Aricept 23 mg strength. MRI brain in February 2011 showed negative findings for any acute process but old stroke in the dorsal portion of the pons and bilateral white matter disease that was unchanged from 2007. He has developed bladder and bowel incontinence but denied hallucinations. He does have anxiety and poor gait and balance. He uses a walker  and a left foot brace. He has fallen several times. In January 2013 his MMSE was 20, clock drawing was 4, animal fluency was 14. He resides in [facility] assisted living. In June 2013 he fell and struck his head. In February 2014 his MMSE was 18, clock drawing was 3, animal fluency was 19. Falls assessment total score was 22.  He was hospitalized in May 2014 with a progressive  cough and SOB and was taken [facility], where he was diagnosed with a PE and no DVT was found. He was initially started on lovenox and coumadin and as his spouse did not want him to be on coumadin, he was changed to xarelto. Venous duplex was negative for DVT. Echo showed LVEF 40%, with inferior and posterior hypokinesis. No right heart strain evident on the echocardiogram. There was an incidental finding of an abdominal aneurysm. Outpatient follow up with a cardiologist was recommended. He sees [facility]. He was treated for CAP (community acquired pneumonia), and started levaquin 750 mg IV daily, and was discharged on 2 more days of levaquin to complete the course. He has a remote Hx of heavy alcohol use, but none in 15 years.   His Past Medical History Is Significant For: Past Medical History  Diagnosis Date  . Hypertension   . Arthritis   . BPH (benign prostatic hypertrophy)   . Carpal tunnel syndrome   . Hyperlipidemia   . Stroke   . Diverticulosis   . Dementia   . Complication of anesthesia     unknown  . Alzheimer's disease   . Edema     leg  . Osteoarthrosis, unspecified whether generalized or localized, unspecified site   . Other acquired deformity of ankle and foot(736.79)   . Memory loss   . Abnormality of gait   . Cervical spondylosis without myelopathy   . Brachial neuritis or radiculitis NOS   . Gout, unspecified     His Past Surgical History Is Significant For: Past Surgical History  Procedure Laterality Date  . Appendectomy    . Carpal tunnel release    . Hip replacement-left    . Joint replacement    . Rt knee replacement      His Family History Is Significant For: Family History  Problem Relation Age of Onset  . Stroke Mother   . Heart disease Mother   . Diabetes Mother     His Social History Is Significant For: History   Social History  . Marital Status: Married    Spouse Name: N/A    Number of Children: N/A  . Years of Education: N/A    Social History Main Topics  . Smoking status: Never Smoker   . Smokeless tobacco: Never Used  . Alcohol Use: No  . Drug Use: No  . Sexual Activity: No   Other Topics Concern  . None   Social History Narrative  . None    His Allergies Are:  No Known Allergies:   His Current Medications Are:  Outpatient Encounter Prescriptions as of 08/09/2013  Medication Sig  . acetaminophen (TYLENOL) 500 MG tablet Take 500 mg by mouth every 6 (six) hours as needed for mild pain or moderate pain.  . carvedilol (COREG) 3.125 MG tablet Take 1 tablet (3.125 mg total) by mouth 2 (two) times daily with a meal.  . citalopram (CELEXA) 10 MG tablet Take 10 mg by mouth daily.  . colchicine 0.6 MG tablet Take 0.6 mg by mouth daily.  Marland Kitchen donepezil (  ARICEPT) 10 MG tablet Take 10 mg by mouth at bedtime.   Marland Kitchen doxycycline (VIBRA-TABS) 100 MG tablet Take 100 mg by mouth 2 (two) times daily.  Marland Kitchen guaiFENesin (MUCINEX) 600 MG 12 hr tablet Take 600 mg by mouth 2 (two) times daily as needed for cough or to loosen phlegm.  . hydrocortisone (ANUSOL-HC) 2.5 % rectal cream Place 1 application rectally 2 (two) times daily as needed for hemorrhoids or itching.  . Memantine HCl ER (NAMENDA XR) 28 MG CP24 Take 28 mg by mouth daily.  . miconazole (MICOTIN) 2 % cream Apply 1 application topically 3 (three) times daily as needed (irritation to buttocks).  . Rivaroxaban (XARELTO) 20 MG TABS Take 1 tablet (20 mg total) by mouth daily.  :  Review of Systems:  Out of a complete 14 point review of systems, all are reviewed and negative with the exception of these symptoms as listed below:   Review of Systems  Constitutional: Positive for activity change.  HENT: Negative.   Eyes: Negative.   Respiratory: Negative.   Cardiovascular: Positive for leg swelling.  Gastrointestinal: Negative.   Endocrine: Negative.   Genitourinary: Negative.   Musculoskeletal: Positive for arthralgias, gait problem and joint swelling.  Skin:  Negative.   Allergic/Immunologic: Negative.   Neurological: Positive for speech difficulty.       Memory loss  Hematological: Negative.   Psychiatric/Behavioral: Negative.     Objective:  Neurologic Exam  Physical Exam Physical Examination:   Filed Vitals:   08/09/13 1532  BP: 101/66  Pulse: 62  Temp: 97.2 F (36.2 C)    General Examination: The patient is a very pleasant 80 y.o. male in no acute distress. He is calm and cooperative with the exam. He denies Auditory Hallucinations and Visual Hallucinations. He is well groomed and situated in a wheelchair with AFO on L and compression socks b/l.   HEENT: Normocephalic, atraumatic, pupils are equal, round and reactive to light and accommodation. Extraocular tracking shows mild saccadic breakdown without nystagmus noted. Hearing is impaired with no hearing aids in place, funduscopic exam is unremarkable. Face is symmetric with no facial masking and normal facial sensation. There is no lip, neck or jaw tremor. There are no carotid bruits on auscultation. Oropharynx exam reveals moderate mouth dryness. No significant airway crowding is noted. Mallampati is class II. Tongue protrudes centrally and palate elevates symmetrically.    Chest: is clear to auscultation without wheezing, rhonchi or crackles noted.  Heart: sounds are regular and normal without murmurs, rubs or gallops noted.   Abdomen: is soft, non-tender and non-distended with normal bowel sounds appreciated on auscultation.  Extremities: There is 2+ pitting edema in the distal lower extremities bilaterally.   Skin: is warm and dry with no trophic changes noted. Age-related changes are noted on the skin.   Musculoskeletal: exam reveals no obvious joint deformities, tenderness or joint swelling or erythema.    Neurologically:  Mental status: The patient is awake and alert, paying fair attention. He is unable to provide the history. His wife provides the entire history. He is  oriented to: person and situation. His memory, attention, language and knowledge are impaired.  08/09/2013: MMSE is 21/30, AFT 11, CDT 1/4.   Cranial nerves are as described above under HEENT exam. In addition, shoulder shrug is normal with equal shoulder height noted.  Motor exam: Normal bulk, and strength for age is noted. Tone is Not rigid with absence of cogwheeling in the extremities. There is overall mild bradykinesia.  There is no drift or rebound. There is no tremor.   Romberg is not tested. Reflexes are 1+ in the upper extremities and absent in the lower extremities. Fine motor skills: Finger taps, hand movements, and rapid alternating patting are moderately impaired bilaterally. Foot taps and foot agility are moderately impaired bilaterally.   Cerebellar testing shows no dysmetria or intention tremor on finger to nose testing. Heel to shin is unremarkable. There is no truncal or gait ataxia.   Sensory exam is decreased to vibration sense and light touch in the lower extremities to the knees. I did not remove his brace nor his compression stockings however.  Gait, station and balance: He was not asked to stand or walk for me today, as he did not bring his walker.   Assessment and Plan:   In summary, Arlester Keehan is a very pleasant 80 year old male with a complex medical history who presents for followup consultation of his dementia without behavioral disturbance. He continues to take Namenda and Aricept. He had a PE in 2014, chronic lower extremities swelling and neuropathy. He has a history of left foot drop in his left foot is in a brace. I had a long chat with the patient and his wife regarding dementia and that he is already on dual therapy. She understands that dementia is progressive. It was recommended last year when he was admitted with a PE that he see cardiology as an outpatient. For some reason this never came to fruition. I will make a referral to cardiology again. I asked him  to drink more water. I asked him to never try to walk without his walker. He is at great fall risk. He has therapy at [facility]. He did not require any refills today and I recommended he continue with Aricept and Namenda. I will see him back in 6 months, sooner if the need arises. They are advised to call with any interim questions, concerns, problems or updates. His wife demonstrated understanding and wished agreement.

## 2013-10-20 ENCOUNTER — Encounter: Payer: Self-pay | Admitting: Internal Medicine

## 2013-10-20 ENCOUNTER — Ambulatory Visit (INDEPENDENT_AMBULATORY_CARE_PROVIDER_SITE_OTHER): Payer: Medicare Other | Admitting: Internal Medicine

## 2013-10-20 VITALS — BP 123/74 | HR 74 | Ht 73.0 in | Wt 200.0 lb

## 2013-10-20 DIAGNOSIS — I714 Abdominal aortic aneurysm, without rupture, unspecified: Secondary | ICD-10-CM | POA: Insufficient documentation

## 2013-10-20 DIAGNOSIS — E785 Hyperlipidemia, unspecified: Secondary | ICD-10-CM

## 2013-10-20 DIAGNOSIS — Z79899 Other long term (current) drug therapy: Secondary | ICD-10-CM

## 2013-10-20 DIAGNOSIS — R0989 Other specified symptoms and signs involving the circulatory and respiratory systems: Secondary | ICD-10-CM

## 2013-10-20 DIAGNOSIS — I428 Other cardiomyopathies: Secondary | ICD-10-CM

## 2013-10-20 DIAGNOSIS — I5021 Acute systolic (congestive) heart failure: Secondary | ICD-10-CM | POA: Insufficient documentation

## 2013-10-20 DIAGNOSIS — I10 Essential (primary) hypertension: Secondary | ICD-10-CM

## 2013-10-20 DIAGNOSIS — R0609 Other forms of dyspnea: Secondary | ICD-10-CM

## 2013-10-20 DIAGNOSIS — I509 Heart failure, unspecified: Secondary | ICD-10-CM

## 2013-10-20 DIAGNOSIS — I429 Cardiomyopathy, unspecified: Secondary | ICD-10-CM

## 2013-10-20 MED ORDER — FUROSEMIDE 20 MG PO TABS: 20.0000 mg | ORAL_TABLET | Freq: Every day | ORAL | Status: AC

## 2013-10-20 NOTE — Patient Instructions (Signed)
Your physician has requested that you have an echocardiogram. Echocardiography is a painless test that uses sound waves to create images of your heart. It provides your doctor with information about the size and shape of your heart and how well your heart's chambers and valves are working. This procedure takes approximately one hour. There are no restrictions for this procedure.  Your physician recommends that you return for lab work next week. (CMET, BNP)  Your physician has recommended you make the following change in your medication: START furosemide 20mg  once daily  Your physician recommends that you schedule a follow-up appointment after your echo with Dr. Rennis Golden.

## 2013-10-20 NOTE — Progress Notes (Signed)
OFFICE NOTE  Chief Complaint:  Establish with a cardiologist, leg swelling  Primary Care Physician: Pcp Not In System  HPI:  Timothy [name] is an 80 y/o M NHP with history of HTN, HL stroke, dementia who presented to [facility] in 05/2012 with complaints of worsening abdominal pain and associated cough. He had been on Avelox at his nuring home for possible pneumonia. On arrival to the ER, lipase was mildly elevated and he was diagnosed with pancreatitis. CT abd/pelvis demonstrated sigmoid diverticulosis without evidence of diverticulitis, probable small infiltrate LLL, 4.4 x 4.0 x 8.4 cm distal abdominal aortic aneurysm without aneurysmal leak, perianeurysmal infiltration or fibrosis, and incidentally showed a large R lung PE. He has subsequently been started Xarelto. LE dopp neg. Cardiology was consulted for abnormal echo (limited windows) showing EF 40% with inferior and posterior hypokinesis, mild LVH, grade 1 diastolic dysfunction. He has denied any chest pain. Timothy [name] did not followup with a cardiologist. Today he is accompanied by his wife who is answering most of the questions do to advanced progressive dementia. She has noted that he's had significant lower extremity swelling and does get short of breath. He said a number of falls and is very unsteady on his feet. He is apparently complaining of knee pain and is not on pain medication. He is wearing compression stockings. He is currently residing at [facility].  PMHx:  Past Medical History  Diagnosis Date  . Hypertension   . Arthritis   . BPH (benign prostatic hypertrophy)   . Carpal tunnel syndrome   . Hyperlipidemia   . Stroke   . Diverticulosis   . Dementia   . Complication of anesthesia     unknown  . Alzheimer's disease   . Edema     leg  . Osteoarthrosis, unspecified whether generalized or localized, unspecified site   . Other acquired deformity of ankle and foot(736.79)   . Memory loss   . Abnormality of gait   .  Cervical spondylosis without myelopathy   . Brachial neuritis or radiculitis NOS   . Gout, unspecified     Past Surgical History  Procedure Laterality Date  . Appendectomy    . Carpal tunnel release    . Hip replacement-left    . Joint replacement    . Rt knee replacement      FAMHx:  Family History  Problem Relation Age of Onset  . Stroke Mother   . Heart disease Mother   . Diabetes Mother     SOCHx:   reports that he has never smoked. He has never used smokeless tobacco. He reports that he does not drink alcohol or use illicit drugs.  ALLERGIES:  No Known Allergies  ROS: A comprehensive review of systems was negative except for: Constitutional: positive for weight gain Cardiovascular: positive for lower extremity edema Musculoskeletal: positive for arthralgias and falls  HOME MEDS: Current Outpatient Prescriptions  Medication Sig Dispense Refill  . acetaminophen (TYLENOL) 500 MG tablet Take 500 mg by mouth every 6 (six) hours as needed for mild pain or moderate pain.      . carvedilol (COREG) 3.125 MG tablet Take 1 tablet (3.125 mg total) by mouth 2 (two) times daily with a meal.  60 tablet  1  . citalopram (CELEXA) 10 MG tablet Take 10 mg by mouth daily.      . colchicine 0.6 MG tablet Take 0.6 mg by mouth daily.      Marland Kitchen donepezil (ARICEPT) 10 MG tablet  Take 10 mg by mouth at bedtime.       Marland Kitchen doxycycline (VIBRA-TABS) 100 MG tablet Take 100 mg by mouth 2 (two) times daily.      Marland Kitchen guaiFENesin (MUCINEX) 600 MG 12 hr tablet Take 600 mg by mouth 2 (two) times daily as needed for cough or to loosen phlegm.      . hydrocortisone (ANUSOL-HC) 2.5 % rectal cream Place 1 application rectally 2 (two) times daily as needed for hemorrhoids or itching.      . Memantine HCl ER (NAMENDA XR) 28 MG CP24 Take 28 mg by mouth daily.  30 capsule  11  . miconazole (MICOTIN) 2 % cream Apply 1 application topically 3 (three) times daily as needed (irritation to buttocks).      . Rivaroxaban  (XARELTO) 20 MG TABS Take 1 tablet (20 mg total) by mouth daily.  30 tablet  1  . furosemide (LASIX) 20 MG tablet Take 1 tablet (20 mg total) by mouth daily.  30 tablet  6   No current facility-administered medications for this visit.    LABS/IMAGING: No results found for this or any previous visit (from the past 48 hour(s)). No results found.  VITALS: BP 123/74  Pulse 74  Ht  (1.854 m)  Wt 200 lb (90.719 kg)  BMI 26.39 kg/m2  EXAM: General appearance: Awake, conversive but with some difficulty Neck: no carotid bruit and no JVD Lungs: diminished breath sounds bilaterally and rales bibasilar Heart: regular rate and rhythm, S1, S2 normal, no murmur, click, rub or gallop Abdomen: protuberant, no masses Extremities: edema 1+ RLE and 2+ LLE edema, compression stockings in place Pulses: 2+ and symmetric Skin: Skin color, texture, turgor normal. No rashes or lesions Neurologic: Mental status: Awake, conversant Psych: Pleasant  EKG: Normal sinus rhythm at 74, possible inferior infarct  ASSESSMENT: 1. Probable ischemic cardiomyopathy with a suggestion of prior inferior infarct, EF 40% 2. Abdominal aortic aneurysm 3. History of PE on Xarelto 4. Acute chronic systolic congestive heart failure, NYHA Class II  PLAN: 1.   Timothy [name] has not had followup for reevaluation of his EF since his hospitalization in 2014. He'll like to repeat his echocardiogram. He does appear to be some degree of decompensated heart failure. He has moderate lower extremity swelling, bibasilar crackles and has had weight gain over the past several weeks. I would recommend starting Lasix 20 mg daily. We will recheck a metabolic profile and BNP next week. His wife is inquiring about possibly switching on Xarelto due to the cost of the medication. We will check with his insurance company to see if there is a more reasonably priced alternative. He will be following of his abdominal aortic aneurysm, however surgery  will be unlikely. Unfortunately, I was told that he's had progressive dementia despite medications. This carries a poor prognosis long-term. He is not a candidate for aggressive intervention, cardiac catheterization or surgical procedures because of this. He is currently on a beta blocker, but cannot tolerate increased doses to 2 blood pressure. There is no room for the addition of an Ace or ARB. He aspirin do to increase bleeding risk on Xarelto.  Plan to see him back in a few weeks to discuss the results of this test and to see if she's had improvement in his swelling. Thank you for allowing me to participate in his care.  Chrystie Nose, MD, [facility] Attending Cardiologist [facility]  Shemicka Cohrs C 10/20/2013, 2:56 PM

## 2013-10-24 ENCOUNTER — Telehealth: Payer: Self-pay | Admitting: Pharmacist Clinician (PhC)/ Clinical Pharmacy Specialist

## 2013-10-24 NOTE — Telephone Encounter (Signed)
Spoke with wife, cost of Xarelto is per month, not in donut-hole.  Advised our best option would be warfarin, and as pt lives at [facility] in [place], they can monitor.  Wife had doubts they could manage this, but I suggested that I would call and speak to pt RN to verify this is possible before making any changes.  Will return call to her after speaking with RN

## 2013-10-24 NOTE — Telephone Encounter (Signed)
Spoke with Joni Reining, Interior and spatial designer at [facility].  She states that they don't have a pharmacy service to monitor INRs, past patients have been monitored by their local doctor.  [facility] can come in each Monday for blood draws.  Joni Reining will check to see if any patient fees associated with that.    Will review with Dr. Rennis Golden, then Mrs. Zapata before making any changes.

## 2013-10-24 NOTE — Telephone Encounter (Signed)
Message copied by Rosalee Kaufman on Mon Oct 24, 2013  4:09 PM ------      Message from: Lindell Spar      Created: Thu Oct 20, 2013  3:45 PM      Regarding: NOAC       xarelto is costing him $160/month - he has Acupuncturist for [employer].. Think he could get a different NOAC or help from drug company?            He was a new patient with [facility] today  ------

## 2013-10-27 ENCOUNTER — Ambulatory Visit (HOSPITAL_COMMUNITY)
Admission: RE | Admit: 2013-10-27 | Discharge: 2013-10-27 | Disposition: A | Discharge status: Home / Self Care | Payer: Medicare Other | Source: Ambulatory Visit | Attending: Cardiology | Admitting: Cardiology

## 2013-10-27 DIAGNOSIS — I429 Cardiomyopathy, unspecified: Secondary | ICD-10-CM

## 2013-10-27 DIAGNOSIS — I10 Essential (primary) hypertension: Secondary | ICD-10-CM | POA: Insufficient documentation

## 2013-10-27 DIAGNOSIS — E785 Hyperlipidemia, unspecified: Secondary | ICD-10-CM | POA: Insufficient documentation

## 2013-10-27 DIAGNOSIS — Z8249 Family history of ischemic heart disease and other diseases of the circulatory system: Secondary | ICD-10-CM | POA: Insufficient documentation

## 2013-10-27 DIAGNOSIS — I359 Nonrheumatic aortic valve disorder, unspecified: Secondary | ICD-10-CM

## 2013-10-27 NOTE — Progress Notes (Signed)
2D Echocardiogram Complete.  10/27/2013   Amere Iott, RDCS  

## 2013-10-28 LAB — COMPREHENSIVE METABOLIC PANEL
ALK PHOS: 78 U/L (ref 39–117)
ALT: 27 U/L (ref 0–53)
AST: 24 U/L (ref 0–37)
Albumin: 3.8 g/dL (ref 3.5–5.2)
BILIRUBIN TOTAL: 0.6 mg/dL (ref 0.2–1.2)
BUN: 30 mg/dL — ABNORMAL HIGH (ref 6–23)
CO2: 21 mEq/L (ref 19–32)
Calcium: 9 mg/dL (ref 8.4–10.5)
Chloride: 107 mEq/L (ref 96–112)
Creat: 1.41 mg/dL — ABNORMAL HIGH (ref 0.50–1.35)
GLUCOSE: 91 mg/dL (ref 70–99)
POTASSIUM: 4.5 meq/L (ref 3.5–5.3)
SODIUM: 143 meq/L (ref 135–145)
TOTAL PROTEIN: 6.3 g/dL (ref 6.0–8.3)

## 2013-10-28 LAB — BRAIN NATRIURETIC PEPTIDE: Brain Natriuretic Peptide: 305.3 pg/mL — ABNORMAL HIGH (ref 0.0–100.0)

## 2013-10-31 NOTE — Telephone Encounter (Signed)
Spoke with wife, she had spoken with RN at [facility].  They could have lab come in for draws on Mondays, but would charge $50 per draw.  No savings to patient family.  Will stay with Xarelto for now.

## 2013-12-19 ENCOUNTER — Telehealth: Payer: Self-pay | Admitting: Neurology

## 2013-12-19 NOTE — Telephone Encounter (Signed)
Spoke with wife to move appt.time for 02/09/14 and she said that patient is in a nursing facility and she will not be able to bring him for his appointment. Will call back to reschedule for a later time.

## 2014-01-05 ENCOUNTER — Ambulatory Visit: Payer: Medicare Other | Admitting: Internal Medicine

## 2014-02-09 ENCOUNTER — Ambulatory Visit: Payer: Medicare Other | Admitting: Neurology

## 2014-04-28 DEATH — deceased

## 2014-06-08 IMAGING — CT CT HEAD W/O CM
2 series · 17 of 30 positions shown, 20 images · non-contrast
Comparison: 01/19/2007

CLINICAL DATA: The patient fell this morning with abrasion to the
mid forehead.

CT HEAD WITHOUT CONTRAST
TECHNIQUE: Contiguous axial images were obtained from the base of
the skull through the vertex without contrast.

[Series 2: head w/o · axial · non-contrast · 0.46mm/px · z∈[-200,-70]mm · 9 of 34 slices shown, 12 images]
[im 4/34  brain]
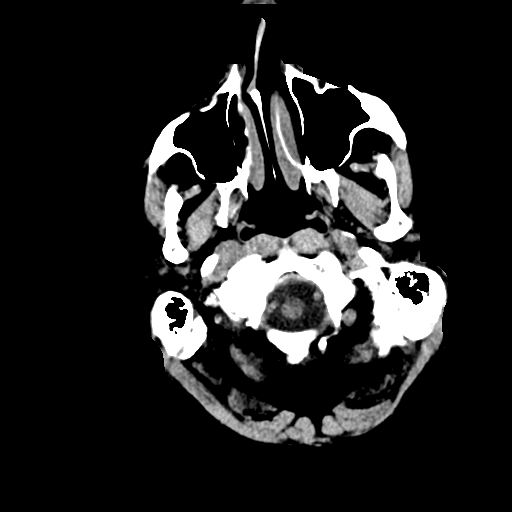
[im 4/34  bone]
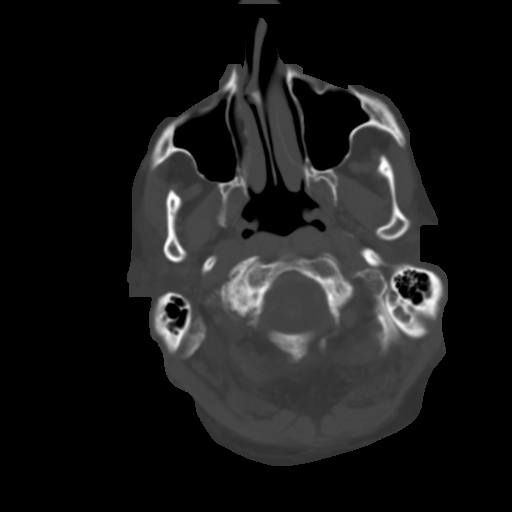
[im 7/34  brain]
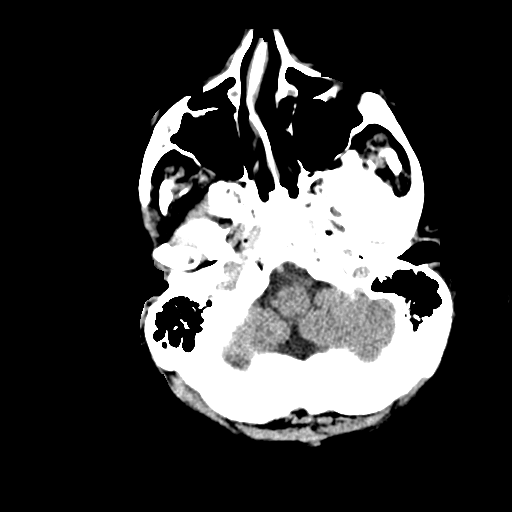
[im 10/34  brain]
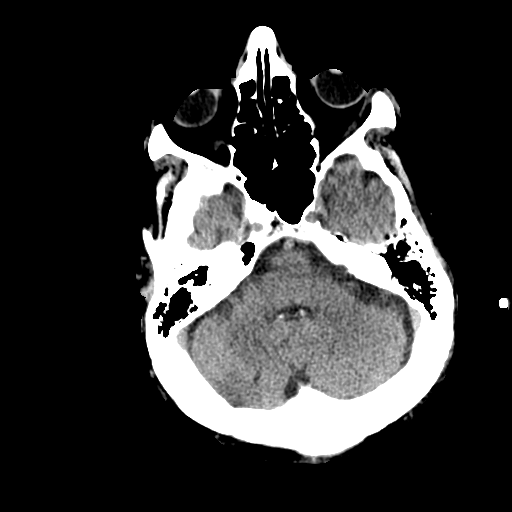
[im 14/34  brain]
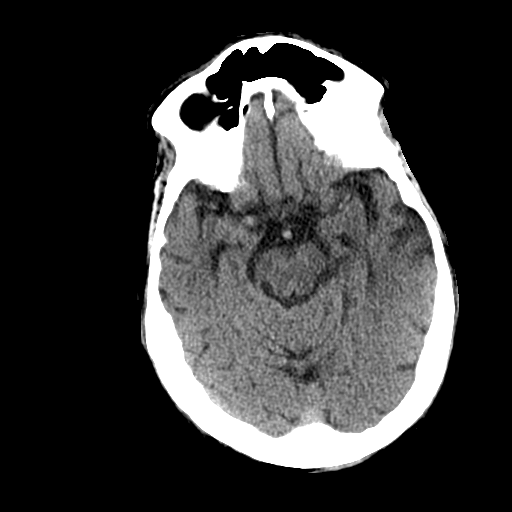
[im 17/34  brain]
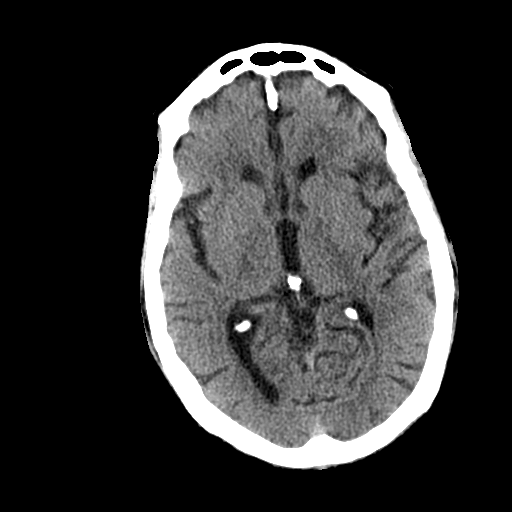
[im 17/34  bone]
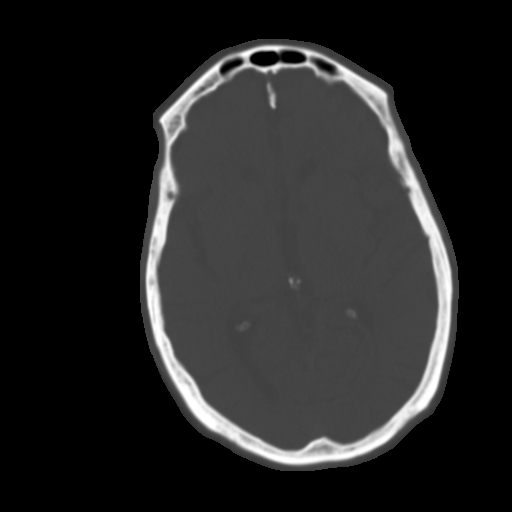
[im 20/34  brain]
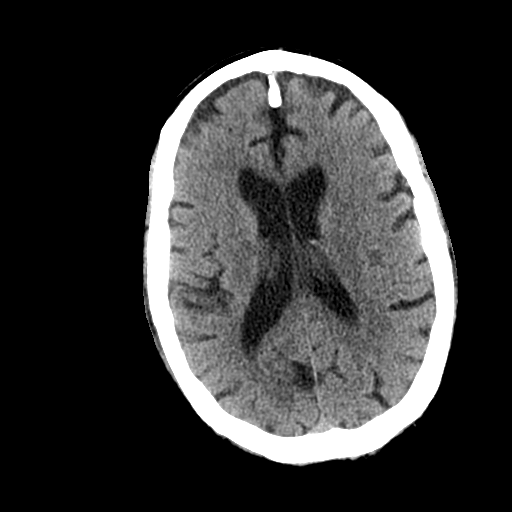
[im 24/34  brain]
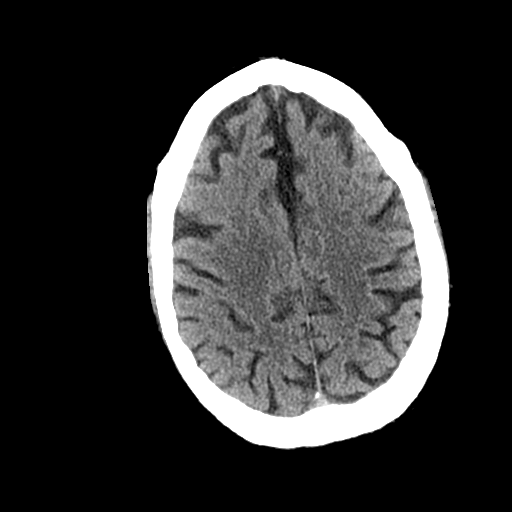
[im 27/34  brain]
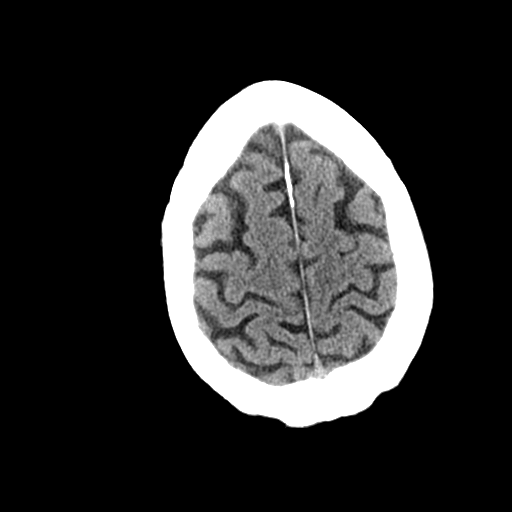
[im 30/34  brain]
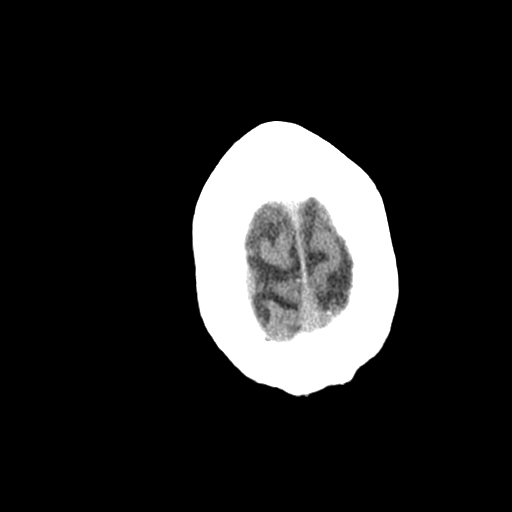
[im 30/34  bone]
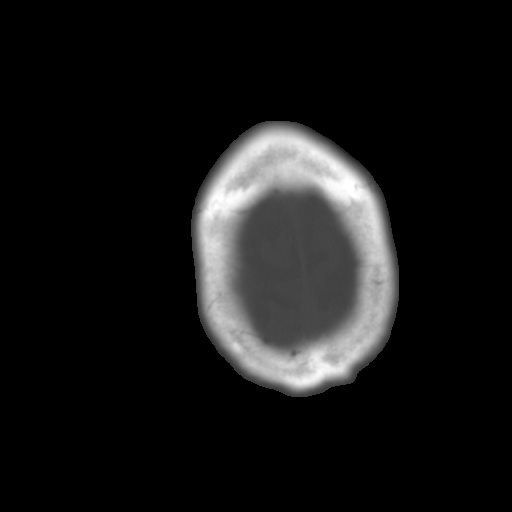

[Series 3: bone windows · axial · 0.46mm/px · z∈[-197,-68]mm · 8 of 57 slices shown]
[im 7/57  bone]
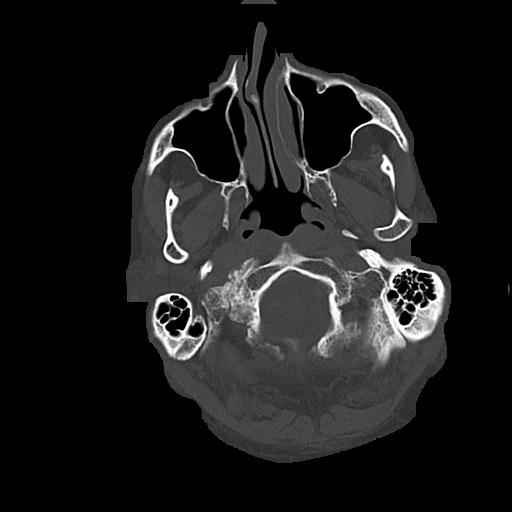
[im 13/57  bone]
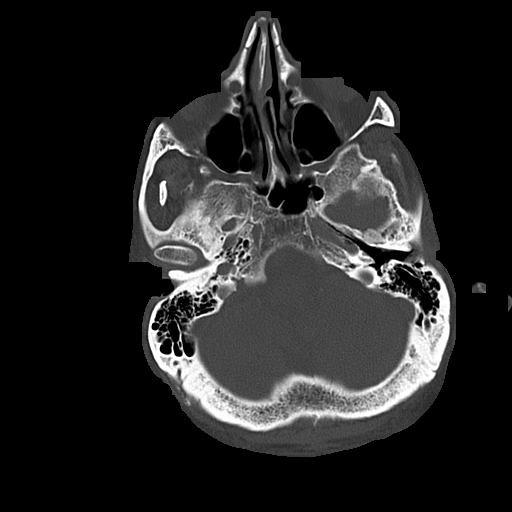
[im 19/57  bone]
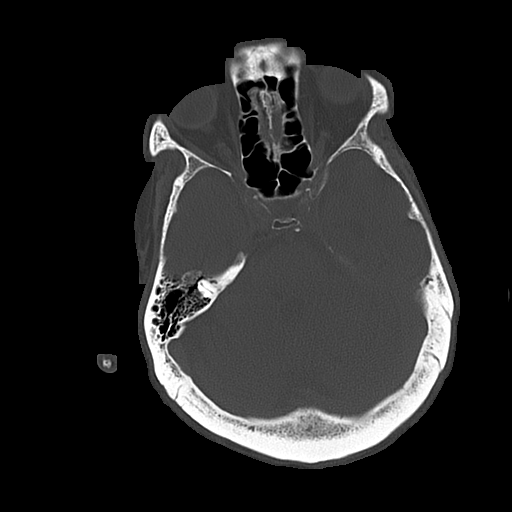
[im 25/57  bone]
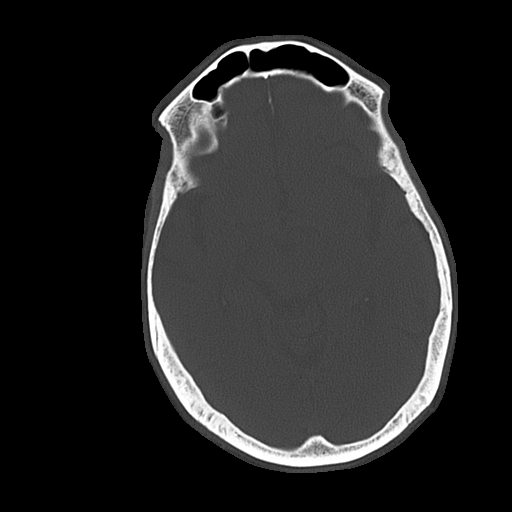
[im 32/57  bone]
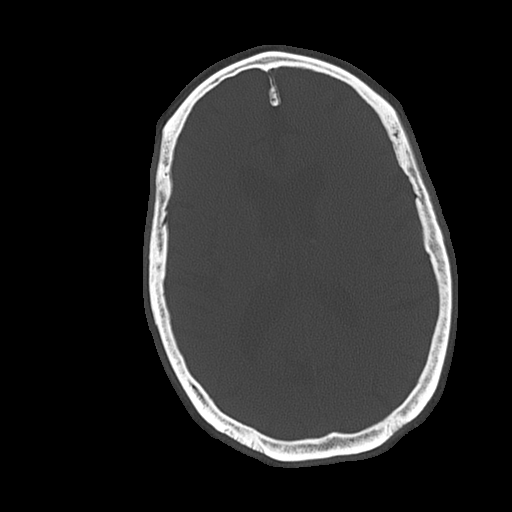
[im 38/57  bone]
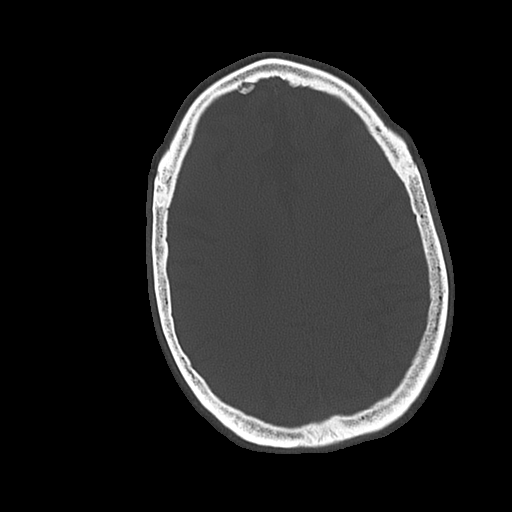
[im 44/57  bone]
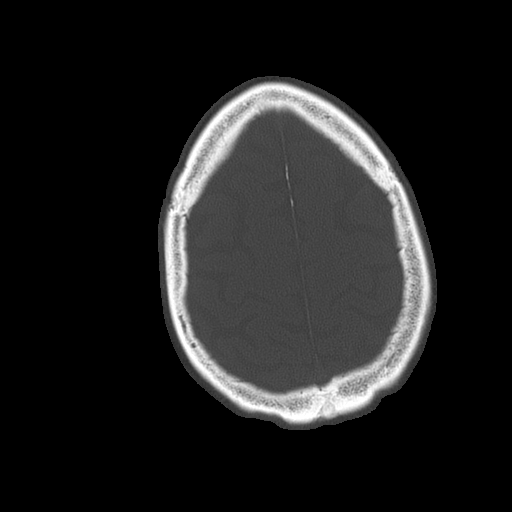
[im 50/57  bone]
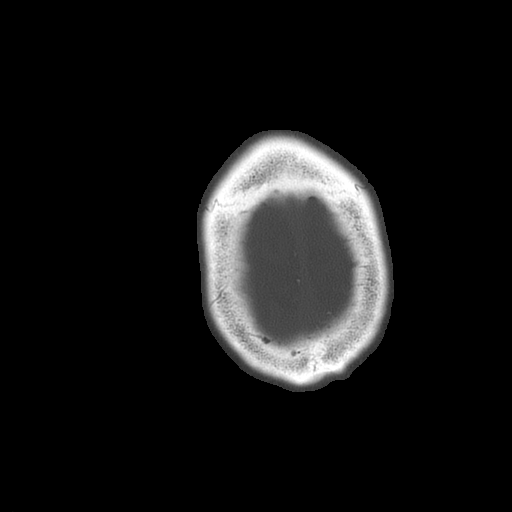

[17 of 30 positions shown; findings below may reference images not displayed]

FINDINGS: Diffuse cerebral atrophy.  Ventricular dilatation
consistent with central atrophy.  Scattered low attenuation changes
in the deep white matter consistent with small vessel ischemic
change.  No mass effect or midline shift.  No abnormal extra-axial
fluid collections.  Gray-white matter junctions are distinct.
Basal cisterns are not effaced.  No evidence of acute intracranial
hemorrhage.  Visualized mastoid air cells and paranasal sinuses are
not opacified.  No depressed skull fractures.  Vascular
calcifications.  No significant changes since the previous study.
IMPRESSION: No acute intracranial abnormalities.  Chronic atrophy and small
vessel ischemic changes.

## 2014-06-08 IMAGING — CR DG PELVIS 1-2V
2 series · 2 of 2 positions shown · non-contrast
Comparison: Pelvic radiograph dated 07/22/2011

CLINICAL DATA: Fall, right hip pain

PELVIS - 1-2 VIEW

[x pelvis (1 of 2)]
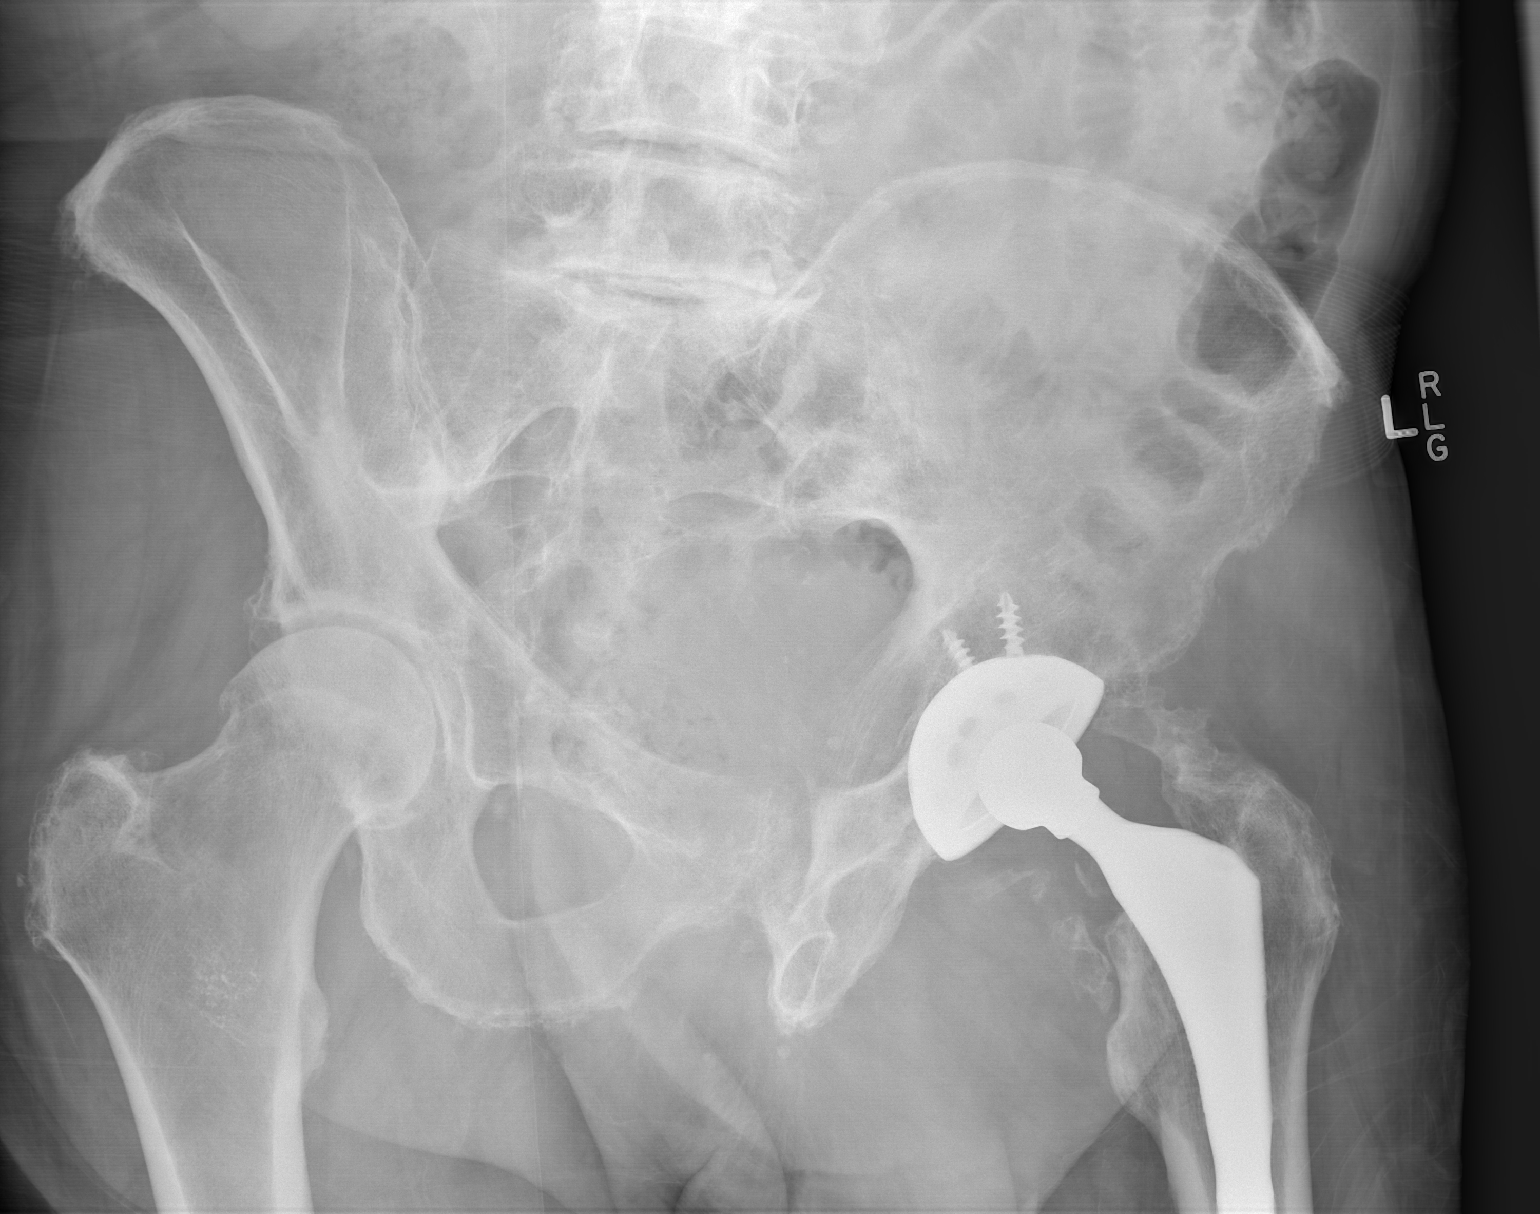

[x pelvis (2 of 2)]
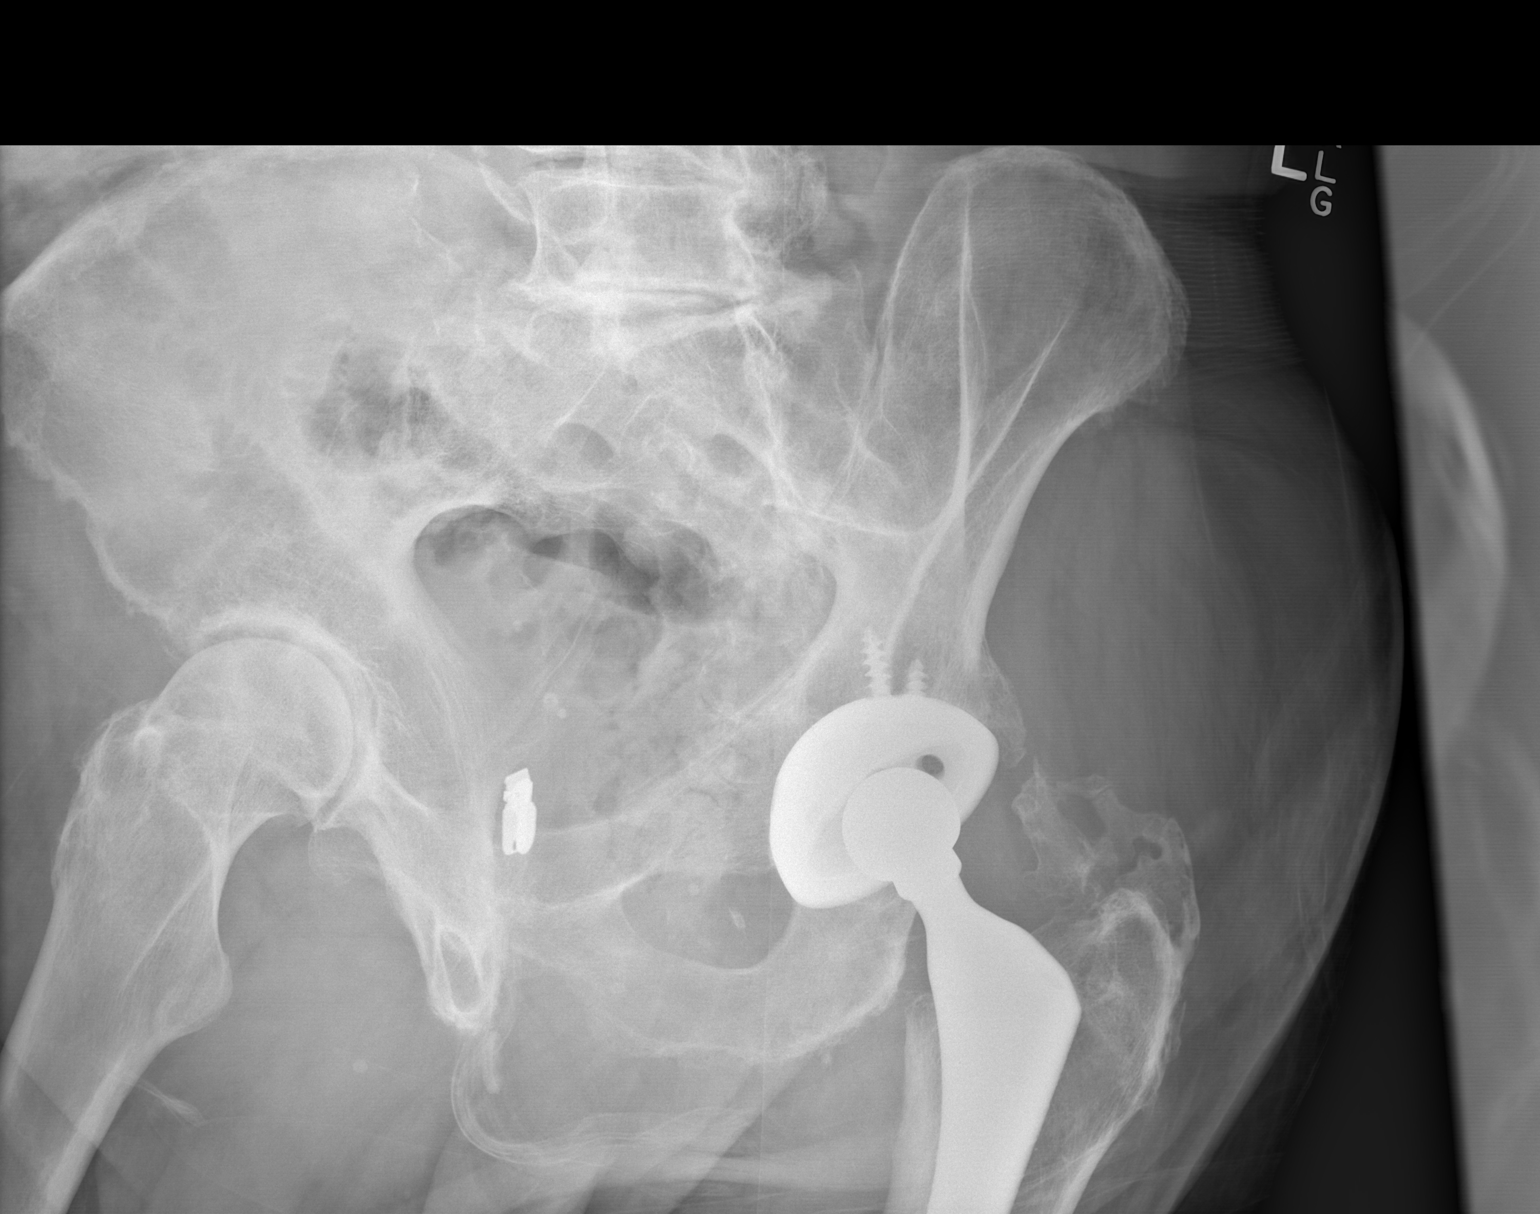

[2 of 2 positions shown; findings below may reference images not displayed]

FINDINGS: No fracture or dislocation is seen.

Left total hip arthroplasty.

Mild degenerative changes of the right hip and lower lumbar spine.
IMPRESSION: No fracture or dislocation is seen.

## 2015-02-18 IMAGING — CT CT HEAD W/O CM
1 of 2 series · 15 of 30 positions shown, 19 images · non-contrast
Comparison: Prior CT head 07/22/2011

CLINICAL DATA: Fall, abrasions

CT HEAD WITHOUT CONTRAST
TECHNIQUE: Contiguous axial images were obtained from the base of
the skull through the vertex without contrast.

[Series 3: recon 2: brain · axial · 0.48mm/px · z∈[-140,+0]mm · 15 of 72 slices shown, 19 images]
[im 4/72  brain]
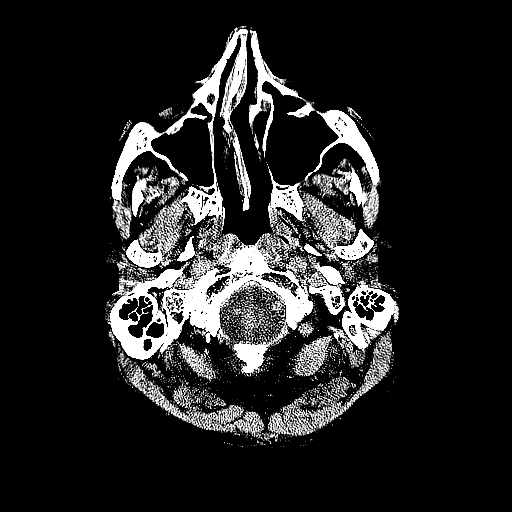
[im 4/72  bone]
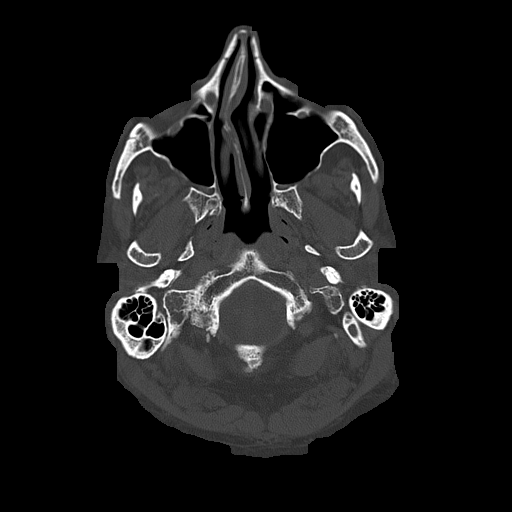
[im 8/72  brain]
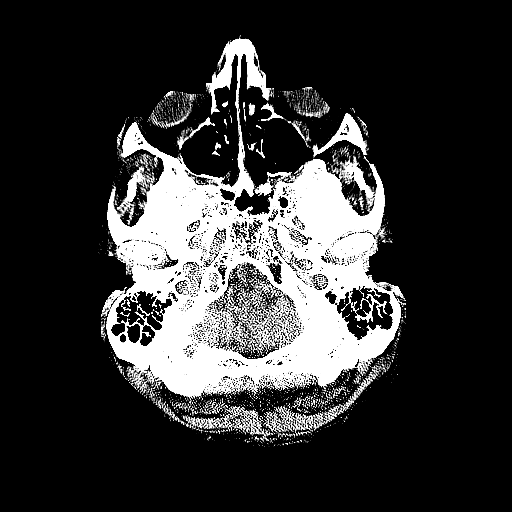
[im 15/72  brain]
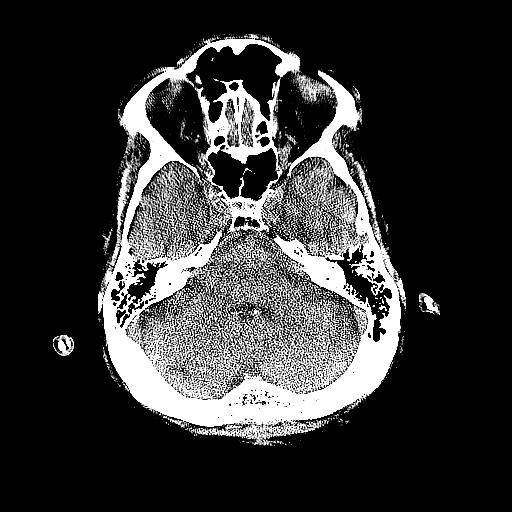
[im 19/72  brain]
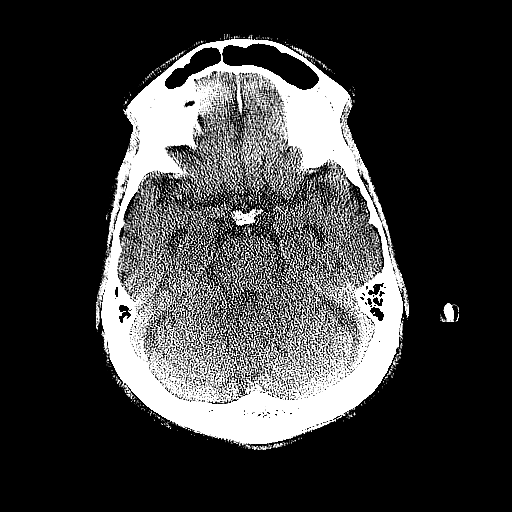
[im 23/72  brain]
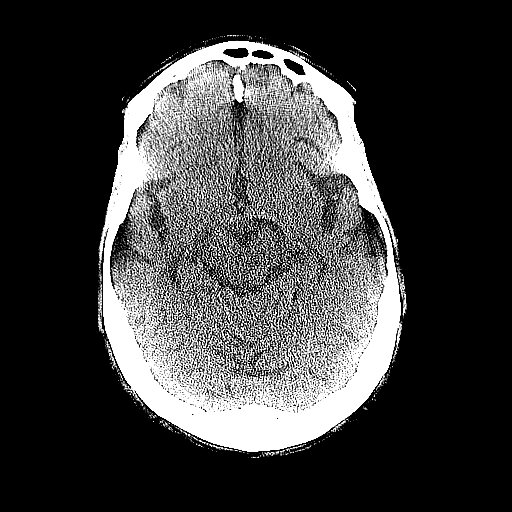
[im 23/72  bone]
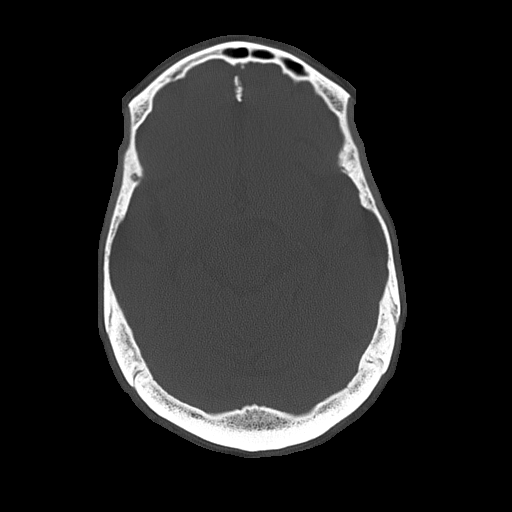
[im 27/72  brain]
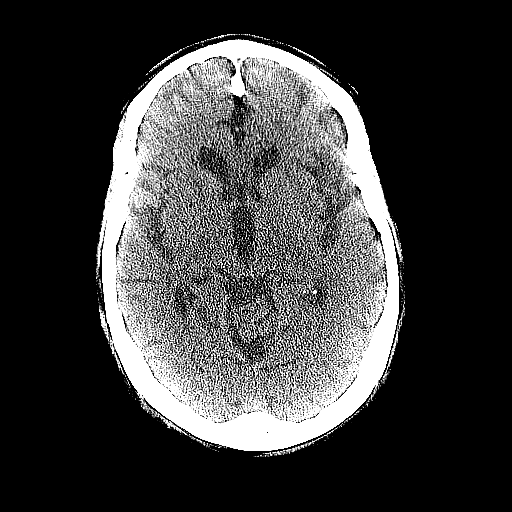
[im 30/72  brain]
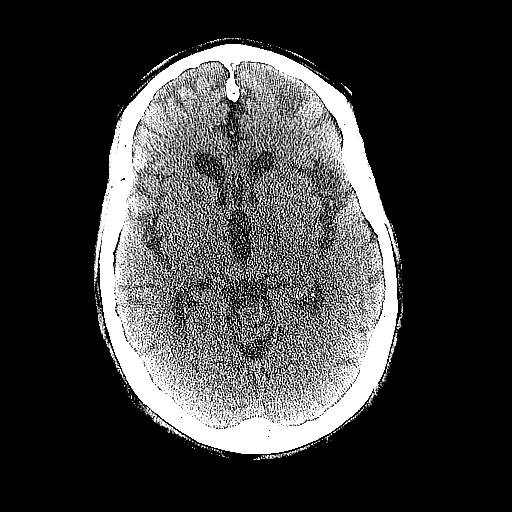
[im 38/72  brain]
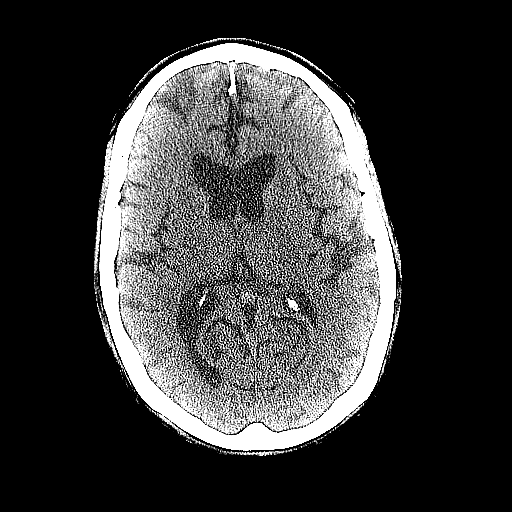
[im 42/72  brain]
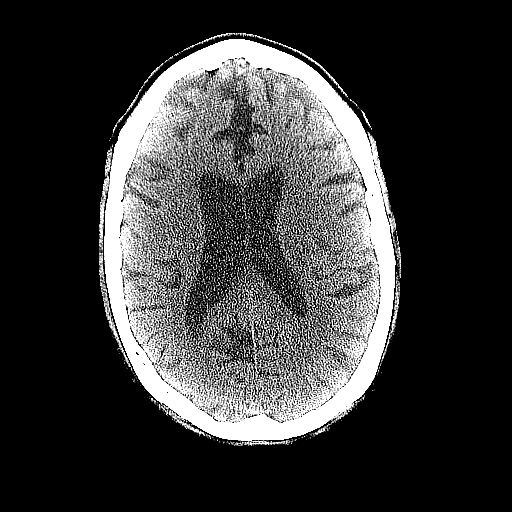
[im 42/72  bone]
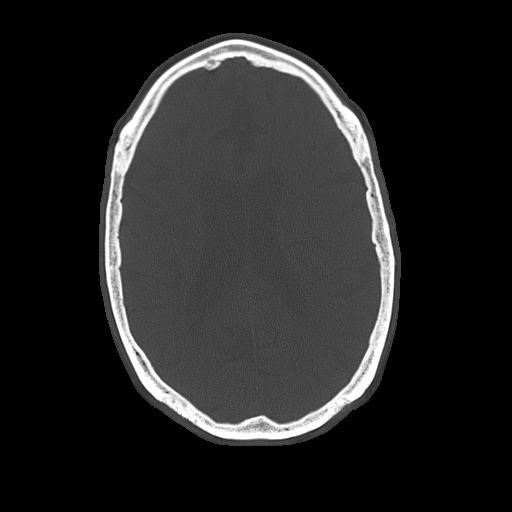
[im 45/72  brain]
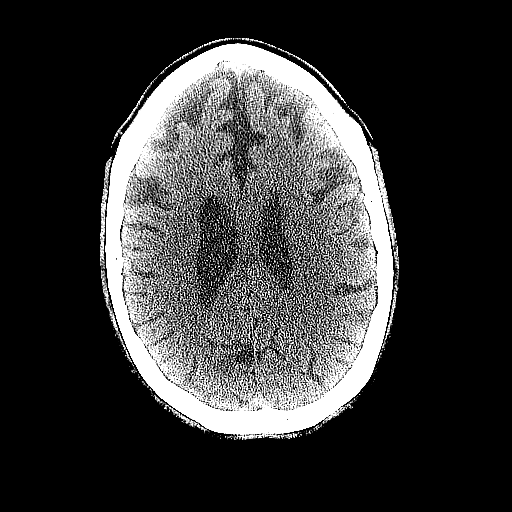
[im 49/72  brain]
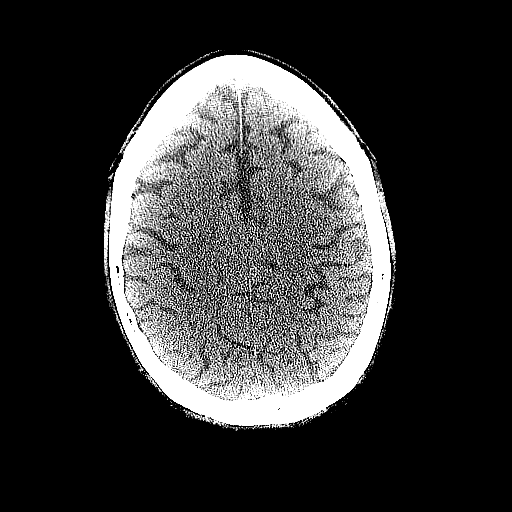
[im 53/72  brain]
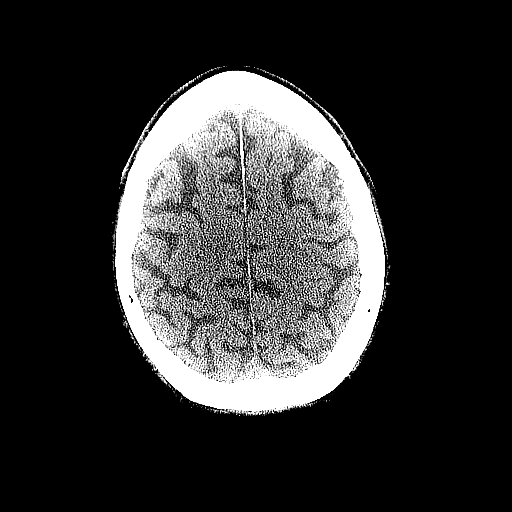
[im 60/72  brain]
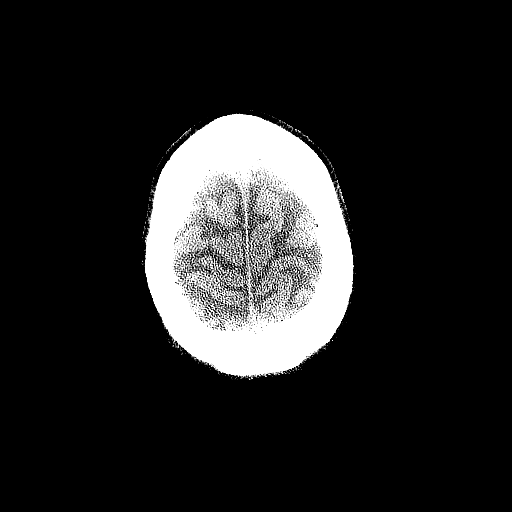
[im 60/72  bone]
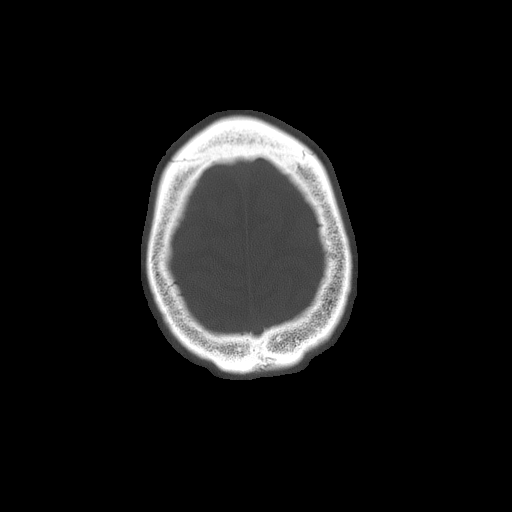
[im 64/72  brain]
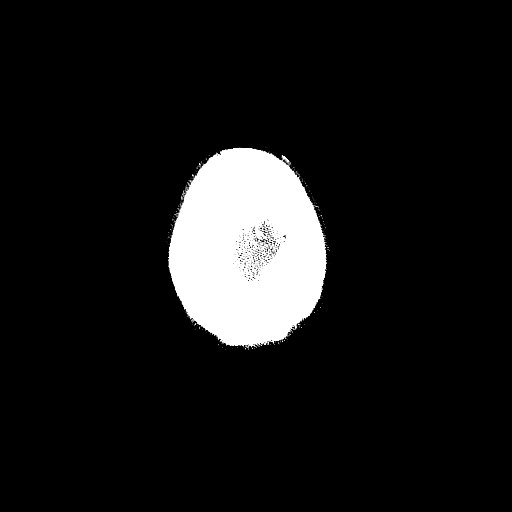
[im 68/72  brain]
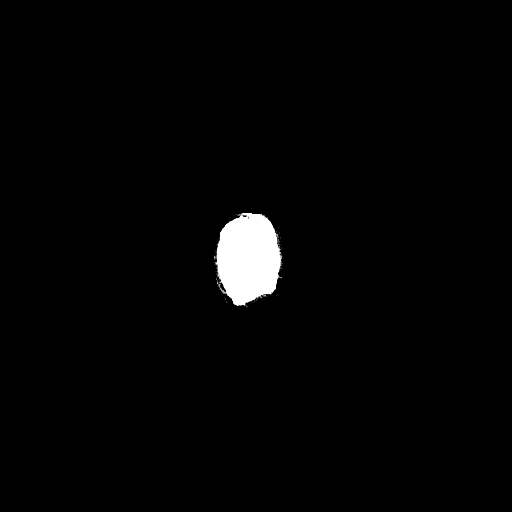

[15 of 30 positions shown; findings below may reference images not displayed]

FINDINGS: No acute intracranial hemorrhage, acute infarction, mass
lesion, mass effect, midline shift or hydrocephalus.  Gray-white
differentiation is preserved throughout. Mildly limited study
secondary to beam hardening artifact from relatively thick
calvarium.  Mild global cerebral and cerebellar volume loss.
Minimal periventricular white matter hypoattenuation consistent
with small vessel ischemic changes.  The globes and orbits are
intact.  No focal scalp hematoma or calvarial fracture.  Incidental
note is made of rightward deviation of the nasal septum.  Paranasal
sinuses and mastoid air cells are well-aerated.
IMPRESSION: No acute intracranial abnormality.  No significant interval change
and age related atrophy and small vessel microvascular ischemic
changes compared to 07/22/2011.
